# Patient Record
Sex: Female | Born: 1970 | Race: Black or African American | Hispanic: No | Marital: Married | Smoking: Never smoker
Health system: Southern US, Community
[De-identification: ages and names within clinical notes are randomized; demographics above are authoritative.]

## PROBLEM LIST (undated history)

## (undated) ENCOUNTER — Inpatient Hospital Stay (HOSPITAL_COMMUNITY): Payer: Self-pay

## (undated) DIAGNOSIS — Z8719 Personal history of other diseases of the digestive system: Secondary | ICD-10-CM

## (undated) DIAGNOSIS — IMO0002 Reserved for concepts with insufficient information to code with codable children: Secondary | ICD-10-CM

## (undated) DIAGNOSIS — M5412 Radiculopathy, cervical region: Secondary | ICD-10-CM

## (undated) DIAGNOSIS — D649 Anemia, unspecified: Secondary | ICD-10-CM

## (undated) DIAGNOSIS — O0940 Supervision of pregnancy with grand multiparity, unspecified trimester: Secondary | ICD-10-CM

## (undated) DIAGNOSIS — A630 Anogenital (venereal) warts: Secondary | ICD-10-CM

## (undated) DIAGNOSIS — R519 Headache, unspecified: Secondary | ICD-10-CM

## (undated) DIAGNOSIS — O09529 Supervision of elderly multigravida, unspecified trimester: Secondary | ICD-10-CM

## (undated) DIAGNOSIS — R51 Headache: Secondary | ICD-10-CM

## (undated) DIAGNOSIS — O24419 Gestational diabetes mellitus in pregnancy, unspecified control: Secondary | ICD-10-CM

## (undated) DIAGNOSIS — R87619 Unspecified abnormal cytological findings in specimens from cervix uteri: Secondary | ICD-10-CM

## (undated) HISTORY — PX: SPINE SURGERY: SHX786

## (undated) HISTORY — PX: HERNIA REPAIR: SHX51

## (undated) HISTORY — PX: GYNECOLOGIC CRYOSURGERY: SHX857

## (undated) HISTORY — DX: Anemia, unspecified: D64.9

## (undated) HISTORY — PX: CHOLECYSTECTOMY: SHX55

## (undated) HISTORY — DX: Reserved for concepts with insufficient information to code with codable children: IMO0002

## (undated) HISTORY — DX: Supervision of elderly multigravida, unspecified trimester: O09.529

## (undated) HISTORY — DX: Anogenital (venereal) warts: A63.0

## (undated) HISTORY — PX: OTHER SURGICAL HISTORY: SHX169

## (undated) HISTORY — PX: DILATION AND CURETTAGE OF UTERUS: SHX78

## (undated) HISTORY — DX: Unspecified abnormal cytological findings in specimens from cervix uteri: R87.619

## (undated) HISTORY — DX: Supervision of pregnancy with grand multiparity, unspecified trimester: O09.40

## (undated) HISTORY — DX: Gestational diabetes mellitus in pregnancy, unspecified control: O24.419

---

## 1998-05-06 ENCOUNTER — Encounter: Admission: RE | Admit: 1998-05-06 | Discharge: 1998-08-04 | Payer: Self-pay | Admitting: Orthopaedic Surgery

## 1998-08-06 ENCOUNTER — Inpatient Hospital Stay (HOSPITAL_COMMUNITY): Admission: AD | Admit: 1998-08-06 | Discharge: 1998-08-08 | Payer: Self-pay | Admitting: Obstetrics

## 1998-08-15 ENCOUNTER — Inpatient Hospital Stay (HOSPITAL_COMMUNITY): Admission: AD | Admit: 1998-08-15 | Discharge: 1998-08-15 | Payer: Self-pay | Admitting: Obstetrics

## 1998-11-01 ENCOUNTER — Inpatient Hospital Stay (HOSPITAL_COMMUNITY): Admission: AD | Admit: 1998-11-01 | Discharge: 1998-11-01 | Payer: Self-pay | Admitting: *Deleted

## 1998-11-11 ENCOUNTER — Ambulatory Visit (HOSPITAL_COMMUNITY): Admission: RE | Admit: 1998-11-11 | Discharge: 1998-11-11 | Payer: Self-pay | Admitting: *Deleted

## 1999-01-26 ENCOUNTER — Inpatient Hospital Stay (HOSPITAL_COMMUNITY): Admission: AD | Admit: 1999-01-26 | Discharge: 1999-01-26 | Payer: Self-pay | Admitting: Obstetrics and Gynecology

## 1999-01-29 ENCOUNTER — Other Ambulatory Visit: Admission: RE | Admit: 1999-01-29 | Discharge: 1999-01-29 | Payer: Self-pay | Admitting: Obstetrics and Gynecology

## 1999-02-25 ENCOUNTER — Inpatient Hospital Stay (HOSPITAL_COMMUNITY): Admission: AD | Admit: 1999-02-25 | Discharge: 1999-02-25 | Payer: Self-pay | Admitting: *Deleted

## 1999-02-26 ENCOUNTER — Inpatient Hospital Stay (HOSPITAL_COMMUNITY): Admission: AD | Admit: 1999-02-26 | Discharge: 1999-02-28 | Payer: Self-pay | Admitting: Obstetrics and Gynecology

## 1999-04-09 ENCOUNTER — Other Ambulatory Visit: Admission: RE | Admit: 1999-04-09 | Discharge: 1999-04-09 | Payer: Self-pay | Admitting: Obstetrics and Gynecology

## 1999-09-21 ENCOUNTER — Other Ambulatory Visit: Admission: RE | Admit: 1999-09-21 | Discharge: 1999-09-21 | Payer: Self-pay | Admitting: Obstetrics and Gynecology

## 1999-10-15 ENCOUNTER — Encounter (INDEPENDENT_AMBULATORY_CARE_PROVIDER_SITE_OTHER): Payer: Self-pay

## 1999-10-15 ENCOUNTER — Other Ambulatory Visit: Admission: RE | Admit: 1999-10-15 | Discharge: 1999-10-15 | Payer: Self-pay | Admitting: Obstetrics and Gynecology

## 2001-03-15 ENCOUNTER — Inpatient Hospital Stay (HOSPITAL_COMMUNITY): Admission: AD | Admit: 2001-03-15 | Discharge: 2001-03-15 | Payer: Self-pay | Admitting: Obstetrics and Gynecology

## 2001-03-15 ENCOUNTER — Encounter: Payer: Self-pay | Admitting: Obstetrics and Gynecology

## 2001-03-16 ENCOUNTER — Encounter (INDEPENDENT_AMBULATORY_CARE_PROVIDER_SITE_OTHER): Payer: Self-pay

## 2001-03-16 ENCOUNTER — Inpatient Hospital Stay (HOSPITAL_COMMUNITY): Admission: AD | Admit: 2001-03-16 | Discharge: 2001-03-16 | Payer: Self-pay | Admitting: Obstetrics and Gynecology

## 2001-03-26 ENCOUNTER — Inpatient Hospital Stay (HOSPITAL_COMMUNITY): Admission: AD | Admit: 2001-03-26 | Discharge: 2001-03-26 | Payer: Self-pay | Admitting: Obstetrics and Gynecology

## 2001-08-16 ENCOUNTER — Encounter (INDEPENDENT_AMBULATORY_CARE_PROVIDER_SITE_OTHER): Payer: Self-pay | Admitting: Specialist

## 2001-08-16 ENCOUNTER — Observation Stay (HOSPITAL_COMMUNITY): Admission: RE | Admit: 2001-08-16 | Discharge: 2001-08-16 | Payer: Self-pay | Admitting: Surgery

## 2001-12-22 ENCOUNTER — Other Ambulatory Visit: Admission: RE | Admit: 2001-12-22 | Discharge: 2001-12-22 | Payer: Self-pay | Admitting: Obstetrics and Gynecology

## 2002-01-22 ENCOUNTER — Ambulatory Visit (HOSPITAL_COMMUNITY): Admission: RE | Admit: 2002-01-22 | Discharge: 2002-01-22 | Payer: Self-pay | Admitting: Obstetrics and Gynecology

## 2002-01-22 ENCOUNTER — Encounter: Payer: Self-pay | Admitting: Obstetrics and Gynecology

## 2002-06-20 ENCOUNTER — Inpatient Hospital Stay (HOSPITAL_COMMUNITY): Admission: AD | Admit: 2002-06-20 | Discharge: 2002-06-20 | Payer: Self-pay | Admitting: Obstetrics and Gynecology

## 2002-06-24 ENCOUNTER — Inpatient Hospital Stay (HOSPITAL_COMMUNITY): Admission: AD | Admit: 2002-06-24 | Discharge: 2002-06-27 | Payer: Self-pay | Admitting: Obstetrics and Gynecology

## 2002-08-14 ENCOUNTER — Other Ambulatory Visit: Admission: RE | Admit: 2002-08-14 | Discharge: 2002-08-14 | Payer: Self-pay | Admitting: Obstetrics and Gynecology

## 2004-04-22 ENCOUNTER — Other Ambulatory Visit: Admission: RE | Admit: 2004-04-22 | Discharge: 2004-04-22 | Payer: Self-pay | Admitting: Obstetrics and Gynecology

## 2004-04-28 ENCOUNTER — Emergency Department (HOSPITAL_COMMUNITY): Admission: EM | Admit: 2004-04-28 | Discharge: 2004-04-28 | Payer: Self-pay | Admitting: Emergency Medicine

## 2004-10-02 ENCOUNTER — Inpatient Hospital Stay (HOSPITAL_COMMUNITY): Admission: AD | Admit: 2004-10-02 | Discharge: 2004-10-02 | Payer: Self-pay | Admitting: Obstetrics and Gynecology

## 2004-10-15 ENCOUNTER — Inpatient Hospital Stay (HOSPITAL_COMMUNITY): Admission: AD | Admit: 2004-10-15 | Discharge: 2004-10-17 | Payer: Self-pay | Admitting: Obstetrics and Gynecology

## 2005-01-04 ENCOUNTER — Encounter: Admission: RE | Admit: 2005-01-04 | Discharge: 2005-02-03 | Payer: Self-pay | Admitting: Obstetrics and Gynecology

## 2005-03-06 ENCOUNTER — Encounter: Admission: RE | Admit: 2005-03-06 | Discharge: 2005-04-05 | Payer: Self-pay | Admitting: Obstetrics and Gynecology

## 2005-04-06 ENCOUNTER — Encounter: Admission: RE | Admit: 2005-04-06 | Discharge: 2005-05-05 | Payer: Self-pay | Admitting: Obstetrics and Gynecology

## 2005-05-06 ENCOUNTER — Encounter: Admission: RE | Admit: 2005-05-06 | Discharge: 2005-06-05 | Payer: Self-pay | Admitting: Obstetrics and Gynecology

## 2005-06-06 ENCOUNTER — Encounter: Admission: RE | Admit: 2005-06-06 | Discharge: 2005-07-05 | Payer: Self-pay | Admitting: Obstetrics and Gynecology

## 2005-07-06 ENCOUNTER — Encounter: Admission: RE | Admit: 2005-07-06 | Discharge: 2005-08-05 | Payer: Self-pay | Admitting: Obstetrics and Gynecology

## 2005-08-06 ENCOUNTER — Encounter: Admission: RE | Admit: 2005-08-06 | Discharge: 2005-09-05 | Payer: Self-pay | Admitting: Obstetrics and Gynecology

## 2005-09-06 ENCOUNTER — Encounter: Admission: RE | Admit: 2005-09-06 | Discharge: 2005-10-03 | Payer: Self-pay | Admitting: Obstetrics and Gynecology

## 2005-10-04 ENCOUNTER — Encounter: Admission: RE | Admit: 2005-10-04 | Discharge: 2005-11-03 | Payer: Self-pay | Admitting: Obstetrics and Gynecology

## 2005-11-04 ENCOUNTER — Encounter: Admission: RE | Admit: 2005-11-04 | Discharge: 2005-11-15 | Payer: Self-pay | Admitting: Obstetrics and Gynecology

## 2006-05-18 ENCOUNTER — Inpatient Hospital Stay (HOSPITAL_COMMUNITY): Admission: AD | Admit: 2006-05-18 | Discharge: 2006-05-18 | Payer: Self-pay | Admitting: Internal Medicine

## 2006-10-28 ENCOUNTER — Inpatient Hospital Stay (HOSPITAL_COMMUNITY): Admission: AD | Admit: 2006-10-28 | Discharge: 2006-10-30 | Payer: Self-pay | Admitting: Obstetrics and Gynecology

## 2006-10-31 ENCOUNTER — Encounter: Admission: RE | Admit: 2006-10-31 | Discharge: 2006-11-29 | Payer: Self-pay | Admitting: Obstetrics and Gynecology

## 2006-11-30 ENCOUNTER — Encounter: Admission: RE | Admit: 2006-11-30 | Discharge: 2006-12-30 | Payer: Self-pay | Admitting: Obstetrics and Gynecology

## 2006-12-31 ENCOUNTER — Encounter: Admission: RE | Admit: 2006-12-31 | Discharge: 2007-01-29 | Payer: Self-pay | Admitting: Obstetrics and Gynecology

## 2007-01-30 ENCOUNTER — Encounter: Admission: RE | Admit: 2007-01-30 | Discharge: 2007-03-01 | Payer: Self-pay | Admitting: Obstetrics and Gynecology

## 2007-02-17 ENCOUNTER — Emergency Department (HOSPITAL_COMMUNITY): Admission: EM | Admit: 2007-02-17 | Discharge: 2007-02-17 | Payer: Self-pay | Admitting: Emergency Medicine

## 2007-03-02 ENCOUNTER — Encounter: Admission: RE | Admit: 2007-03-02 | Discharge: 2007-04-01 | Payer: Self-pay | Admitting: Obstetrics and Gynecology

## 2007-04-02 ENCOUNTER — Encounter: Admission: RE | Admit: 2007-04-02 | Discharge: 2007-05-01 | Payer: Self-pay | Admitting: Obstetrics and Gynecology

## 2007-05-02 ENCOUNTER — Encounter: Admission: RE | Admit: 2007-05-02 | Discharge: 2007-06-01 | Payer: Self-pay | Admitting: Obstetrics and Gynecology

## 2007-06-02 ENCOUNTER — Encounter: Admission: RE | Admit: 2007-06-02 | Discharge: 2007-07-01 | Payer: Self-pay | Admitting: Obstetrics and Gynecology

## 2007-07-02 ENCOUNTER — Encounter: Admission: RE | Admit: 2007-07-02 | Discharge: 2007-08-01 | Payer: Self-pay | Admitting: Obstetrics and Gynecology

## 2007-08-02 ENCOUNTER — Encounter: Admission: RE | Admit: 2007-08-02 | Discharge: 2007-09-01 | Payer: Self-pay | Admitting: Obstetrics and Gynecology

## 2007-09-02 ENCOUNTER — Encounter: Admission: RE | Admit: 2007-09-02 | Discharge: 2007-09-23 | Payer: Self-pay | Admitting: Obstetrics and Gynecology

## 2007-09-30 ENCOUNTER — Encounter: Admission: RE | Admit: 2007-09-30 | Discharge: 2007-10-30 | Payer: Self-pay | Admitting: Obstetrics and Gynecology

## 2007-10-31 ENCOUNTER — Encounter: Admission: RE | Admit: 2007-10-31 | Discharge: 2007-11-29 | Payer: Self-pay | Admitting: Obstetrics and Gynecology

## 2007-11-30 ENCOUNTER — Encounter: Admission: RE | Admit: 2007-11-30 | Discharge: 2007-12-05 | Payer: Self-pay | Admitting: Obstetrics and Gynecology

## 2009-07-24 ENCOUNTER — Encounter: Admission: RE | Admit: 2009-07-24 | Discharge: 2009-07-24 | Payer: Self-pay | Admitting: Family Medicine

## 2010-11-16 ENCOUNTER — Other Ambulatory Visit: Payer: Self-pay | Admitting: Obstetrics and Gynecology

## 2010-11-16 DIAGNOSIS — Z1231 Encounter for screening mammogram for malignant neoplasm of breast: Secondary | ICD-10-CM

## 2010-11-26 ENCOUNTER — Ambulatory Visit
Admission: RE | Admit: 2010-11-26 | Discharge: 2010-11-26 | Disposition: A | Discharge status: Home / Self Care | Payer: BC Managed Care – PPO | Source: Ambulatory Visit | Attending: Obstetrics and Gynecology | Admitting: Obstetrics and Gynecology

## 2010-11-26 DIAGNOSIS — Z1231 Encounter for screening mammogram for malignant neoplasm of breast: Secondary | ICD-10-CM

## 2010-12-11 NOTE — Op Note (Signed)
[facility]  Patient:    Rachel Zamora, Rachel Zamora Visit Number: [id number] MRN: [id number]          Service Type: SUR Location: 4W [id number] 01 Attending Physician:  Charlton Haws Dictated by:   Currie Paris, M.D. Proc. Date: 08/16/01 Admit Date:  08/16/2001 Discharge Date: 08/16/2001   CC:         Geraldo Pitter, M.D.  Petra Kuba, M.D.   Operative Report  VISIT NUMBER:  [id number].  OFFICE MEDICAL RECORD NUMBER:  [id number].  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  POSTOPERATIVE DIAGNOSIS:  Chronic calculus cholecystitis with umbilical hernia.  OPERATION:  Laparoscopic cholecystectomy with repair of umbilical hernia.  SURGEON:  Currie Paris, M.D.  ASSISTANT:  Zigmund Daniel, M.D.  ANESTHESIA:  General endotracheal.  CLINICAL HISTORY:  This is a 40 year old lady with gallstones and symptoms consistent with biliary colic. There was a question of tiny umbilical hernia also noted on physical preoperatively which is asymptomatic.  DESCRIPTION OF PROCEDURE:  The patient was seen in the holding area and had no further questions. Laboratory studies were unremarkable. The patient was taken to the operating room and after satisfactory condition general endotracheal anesthesia was obtained, the abdomen was prepped and draped. After injecting some local, I made an umbilical incision, identified the fascia, opened it and entered the peritoneal cavity. A Hasson was placed and held in place with a pursestring. The abdomen was insufflated to 15 and the patient placed in reverse Trendelenburg and tilted left. A 10 mm trocar was placed in the epigastrium and two 5 mm trocars placed laterally. The gallbladder was chronically inflamed, white in color, not particularly distended. It was retracted over the liver and multiple adhesions taken down. The patient is very thin and there appeared  to a very long cystic duct. It was thought perhaps there was some stones in it. Once we dissected that out, we could see its junction with the gallbladder. We could see well into the triangle of Calot because there was basically no fatty tissue there. We saw what appeared to be the hepatic artery arcing up and then up into the liver with the cystic artery coming off of that. The common duct was seen further down. Once the anatomy had been cleared up and we had everything dissected out, I put two clips on the stay side and one on the gallbladder side of the cystic duct and divided it. The artery was likewise divided. Another small area that I thought might have small vessels was clipped once and divided. The gallbladder was removed from below to above with cautery. Just prior to disconnecting it, at the very top of the liver edge, we got into the gallbladder and saw a little bit of white bile. Once it was disconnected, we checked for hemostasis and everything was dry. The gallbladder was placed in a bag and brought out the umbilical port. The lateral ports were removed. As I removed the umbilical port, I noted that it basically had a tiny strand of fascia and then an umbilical defect, so the skin incision was extended up into the umbilicus and the skin freed up so that I could close the umbilical hernia along with the fascial opening that I had made. This was done with several sutures of 0 Vicryl.  The abdomen was reinsufflated and a final check made with a camera in the epigastric port. That was removed and the abdomen deflated through  the epigastric port. I could see a little opening in the fascia there so I closed that with a 0 Vicryl.  The skin was closed with 4-0 Monocryl subcuticular plus Steri-Strips. The patient tolerated the procedure well. There were no operative complications. All counts were correct. Dictated by:   Currie Paris, M.D. Attending Physician:  Charlton Haws DD:  08/16/01 TD:  08/16/01 Job: [id number]

## 2010-12-11 NOTE — Discharge Summary (Signed)
Rachel Zamora, Rachel Zamora               ACCOUNT NO.:  [id number]   MEDICAL RECORD NO.:  [id number]          PATIENT TYPE:  INP   LOCATION:  9119                          FACILITY:  [facility]   PHYSICIAN:  Zenaida Niece, M.D.DATE OF BIRTH:  06/22/1971   DATE OF ADMISSION:  10/28/2006  DATE OF DISCHARGE:  10/30/2006                               DISCHARGE SUMMARY   ADMISSION DIAGNOSIS:  Intrauterine pregnancy at 37 weeks   DISCHARGE DIAGNOSIS:  Intrauterine pregnancy at 37 weeks.   PROCEDURES:  On October 28, 2006, she had a spontaneous vaginal delivery.   HISTORY AND PHYSICAL:  This is a 40 year old black female, gravida 61,  para 7-0-3-7 with an EGA of 37+ weeks who presents with complaint of  regular contractions and possibly leaking fluid.  In maternity  admissions, she was having regular contractions without cervical change;  however, the baby had a spontaneous deceleration to the 80s for  approximately 3 minutes with good recovery, so the patient was admitted  by Dr. Senaida Ores for delivery.   Prenatal care complicated by the fact that she is a grand multipara with  advanced maternal age with normal first trimester screen.   PRENATAL LABORATORY DATA:  Blood type is A+ with negative antibody  screen. Hemoglobin electrophoresis is normal, RPR nonreactive, rubella  immune, hepatitis B surface antigen negative, gonorrhea and chlamydia  negative; 1-hour Glucola is 144, 3-hour GTT is normal, and group B strep  is negative.   PAST OB HISTORY:  1. One elective termination.  2. Two spontaneous abortions.  3. Seven vaginal deliveries at term without complications with weights      ranging from 6 pounds 4 ounces to 8 pounds 13 ounces.   PAST MEDICAL HISTORY:  Significant for anemia.   PAST SURGICAL HISTORY:  Cholecystectomy and hernia repair.   PHYSICAL EXAMINATION:  VITAL SIGNS:  She is afebrile with stable vital  signs.  Fetal heart tracing is overall reassuring except for the one  spontaneous deceleration.  ABDOMEN:  Abdomen is gravid, nontender.  PELVIC:  Cervix is 3, 50, -2.  An amniotomy by Dr. Senaida Ores reveals  clear fluid. Fetal scalp electrode was placed.   HOSPITAL COURSE:  The patient was admitted and had amniotomy performed  and was started on Pitocin.  She initially did not progress quickly and  had an IUPC placed to better monitor her contractions.  She then entered  more active labor, progressed fairly quickly to complete, pushed well,  and on the afternoon of April4 had a vaginal delivery of a viable  female infant with Apgars of 9/9 with weighed 7 pounds 2 ounces.  Fetal  heart tracing was reassuring in labor.  Placenta delivered spontaneously  and was intact.  It was sent for cord blood collection.  She had a small  first-degree laceration which was hemostatic and not repaired.  Of note,  there is a well-healed small hole in the superior right labia from prior  trauma.  Estimated blood loss was less than 500 mL.   Postpartum, she had no significant complications.  Pre-delivery  hemoglobin 12.6, postdelivery 10.8.  On postpartum #2, she was felt to  be stable enough for discharge home.   DISCHARGE INSTRUCTIONS:  Regular diet, pelvic rest, follow up in 6  weeks.   MEDICATIONS:  1. Percocet, #20, one to two p.o. q. 4-6 hours p.r.n. pain.  2. Over-the-counter ibuprofen as needed.   She is also given our discharge pamphlet.      Zenaida Niece, M.D.  Electronically Signed     TDM/MEDQ  D:  10/30/2006  T:  10/30/2006  Job:  [id number]

## 2011-01-13 ENCOUNTER — Other Ambulatory Visit: Payer: Self-pay | Admitting: Family Medicine

## 2011-01-13 ENCOUNTER — Ambulatory Visit
Admission: RE | Admit: 2011-01-13 | Discharge: 2011-01-13 | Disposition: A | Discharge status: Home / Self Care | Payer: BC Managed Care – PPO | Source: Ambulatory Visit | Attending: Family Medicine | Admitting: Family Medicine

## 2011-01-13 DIAGNOSIS — W19XXXA Unspecified fall, initial encounter: Secondary | ICD-10-CM

## 2011-02-02 ENCOUNTER — Emergency Department (HOSPITAL_COMMUNITY)
Admission: EM | Admit: 2011-02-02 | Discharge: 2011-02-02 | Disposition: A | Discharge status: Home / Self Care | Payer: No Typology Code available for payment source | Attending: Emergency Medicine | Admitting: Emergency Medicine

## 2011-02-02 ENCOUNTER — Emergency Department (HOSPITAL_COMMUNITY): Payer: No Typology Code available for payment source

## 2011-02-02 DIAGNOSIS — S060X1A Concussion with loss of consciousness of 30 minutes or less, initial encounter: Secondary | ICD-10-CM | POA: Insufficient documentation

## 2011-02-02 DIAGNOSIS — R51 Headache: Secondary | ICD-10-CM | POA: Insufficient documentation

## 2011-05-10 LAB — POCT I-STAT CREATININE
Creatinine, Ser: 0.6
Operator id: 279831

## 2011-05-10 LAB — CBC
HCT: 36.5
MCHC: 34
MCV: 88.5
Platelets: 331
WBC: 8.1

## 2011-05-10 LAB — DIFFERENTIAL
Basophils Relative: 0
Eosinophils Absolute: 0.1
Eosinophils Relative: 1
Lymphs Abs: 2.6
Monocytes Relative: 7

## 2011-05-10 LAB — I-STAT 8, (EC8 V) (CONVERTED LAB)
BUN: 7
Chloride: 104
HCT: 41
Hemoglobin: 13.9
Operator id: 279831
Sodium: 140

## 2011-05-10 LAB — POCT CARDIAC MARKERS: Operator id: 279831

## 2011-07-27 NOTE — L&D Delivery Note (Signed)
Delivery Note At 5:53 PM a viable female was delivered via Vaginal, Spontaneous Delivery (Presentation: Middle Occiput Anterior).  APGAR: 9, 9; weight pending .   Placenta status: Intact, Spontaneous.  Cord: 3 vessels with the following complications:Tight nuchal x 2 delivered through.  Anesthesia: Epidural  Episiotomy: None Lacerations: 1st degree Suture Repair: 3.0 vicryl rapide Est. Blood Loss (mL): 300cc  Mom to postpartum.  Baby to stay in room with mom.  Oliver Pila 05/12/2012, 6:14 PM

## 2011-10-18 LAB — OB RESULTS CONSOLE HIV ANTIBODY (ROUTINE TESTING): HIV: NONREACTIVE

## 2011-10-18 LAB — OB RESULTS CONSOLE ABO/RH: RH Type: POSITIVE

## 2011-10-18 LAB — OB RESULTS CONSOLE HEPATITIS B SURFACE ANTIGEN: Hepatitis B Surface Ag: NEGATIVE

## 2011-10-18 LAB — OB RESULTS CONSOLE RUBELLA ANTIBODY, IGM: Rubella: IMMUNE

## 2011-12-22 ENCOUNTER — Encounter: Payer: BC Managed Care – PPO | Attending: Obstetrics and Gynecology | Admitting: Dietician

## 2011-12-22 DIAGNOSIS — O99810 Abnormal glucose complicating pregnancy: Secondary | ICD-10-CM | POA: Insufficient documentation

## 2011-12-22 DIAGNOSIS — Z713 Dietary counseling and surveillance: Secondary | ICD-10-CM | POA: Insufficient documentation

## 2011-12-23 ENCOUNTER — Encounter: Payer: Self-pay | Admitting: Dietician

## 2011-12-23 NOTE — Progress Notes (Signed)
  Patient was seen on 12/22/2011 for Gestational Diabetes self-management class at the [facility]. The following learning objectives were met by the patient during this course:   States the definition of Gestational Diabetes  States why dietary management is important in controlling blood glucose  Describes the effects each nutrient has on blood glucose levels  Demonstrates ability to create a balanced meal plan  Demonstrates carbohydrate counting   States when to check blood glucose levels  Demonstrates proper blood glucose monitoring techniques  States the effect of stress and exercise on blood glucose levels  States the importance of limiting caffeine and abstaining from alcohol and smoking  Blood glucose monitor given: One Touch Ultra Mini with the [employer] Lot # [id number] X Exp: 05/2012 Blood glucose reading: 91 at 6:30 PM  Patient instructed to monitor glucose levels: FBS: 60 - <90 1 hour: <140   Patient received handouts:  Nutrition Diabetes and Pregnancy  Carbohydrate Counting List  Patient will be seen for follow-up as needed.

## 2012-04-30 ENCOUNTER — Encounter (HOSPITAL_COMMUNITY): Payer: Self-pay

## 2012-04-30 ENCOUNTER — Emergency Department (HOSPITAL_COMMUNITY)
Admission: EM | Admit: 2012-04-30 | Discharge: 2012-04-30 | Disposition: A | Discharge status: Home / Self Care | Payer: BC Managed Care – PPO | Source: Home / Self Care | Attending: Emergency Medicine | Admitting: Emergency Medicine

## 2012-04-30 DIAGNOSIS — H00011 Hordeolum externum right upper eyelid: Secondary | ICD-10-CM

## 2012-04-30 DIAGNOSIS — H00019 Hordeolum externum unspecified eye, unspecified eyelid: Secondary | ICD-10-CM

## 2012-04-30 MED ORDER — CEPHALEXIN 500 MG PO CAPS: 500.0000 mg | ORAL_CAPSULE | Freq: Three times a day (TID) | ORAL | Status: DC

## 2012-04-30 MED ORDER — TOBRAMYCIN 0.3 % OP SOLN: 1.0000 [drp] | OPHTHALMIC | Status: DC

## 2012-04-30 NOTE — ED Notes (Signed)
Right eye swollen and painful x 2 days, patient states had same problem approx 6 weeks ago

## 2012-04-30 NOTE — ED Provider Notes (Signed)
Chief Complaint  Patient presents with  . Eye Problem    History of Present Illness:   Rachel Zamora is a 41 year old female who is pregnant and at near term. For the past 3-4 days she's had swelling and slight tenderness of the right upper lid. She's had a little crusting of the eyelids but no redness of the eyes, diplopia, or blurry vision. She had a similar episode about a month ago and went to an urgent care on [address]. She was given an antibiotic drop and the symptoms got better but then they recurred again. She denies fever, chills, headache, nasal congestion, rhinorrhea, sore throat, or adenopathy.  Review of Systems:  Other than noted above, the patient denies any of the following symptoms: Systemic:  No fever, chills, sweats, fatigue, or weight loss. Eye:  No redness, eye pain, photophobia, discharge, blurred vision, or diplopia. ENT:  No nasal congestion, rhinorrhea, or sore throat. Lymphatic:  No adenopathy. Skin:  No rash or pruritis.  PMFSH:  Past medical history, family history, social history, meds, and allergies were reviewed.  Physical Exam:   Vital signs:  BP 153/94  Pulse 83  Temp 98.4 F (36.9 C) (Oral)  Resp 18  SpO2 99%  LMP 11/25/2010 General:  Alert and in no distress. Eye:  There was swelling, erythema, and slight tenderness to palpation over the mid upper lid at the eyelid margin and otherwise lids and conjunctiva is were normal. There was no conjunctival injection or discharge. Anterior chambers are normal. PERRLA, full EOMs, fundi were benign. ENT:  TMs and canals clear.  Nasal mucosa normal.  No intra-oral lesions, mucous membranes moist, pharynx clear. Neck:  No adenopathy tenderness or mass. Skin:  Clear, warm and dry.    Assessment:  The encounter diagnosis was Hordeolum externum of right upper eyelid.  Plan:   1.  The following meds were prescribed:   New Prescriptions   CEPHALEXIN (KEFLEX) 500 MG CAPSULE    Take 1 capsule (500 mg total) by mouth 3  (three) times daily.   TOBRAMYCIN (TOBREX) 0.3 % OPHTHALMIC SOLUTION    Place 1 drop into the right eye every 4 (four) hours.   2.  The patient was instructed in symptomatic care and handouts were given. 3.  The patient was told to return if becoming worse in any way, if no better in 3 or 4 days, and given some red flag symptoms that would indicate earlier return.     Reuben Likes, MD 04/30/12 [phone]

## 2012-05-07 ENCOUNTER — Encounter (HOSPITAL_COMMUNITY): Payer: Self-pay | Admitting: *Deleted

## 2012-05-07 ENCOUNTER — Inpatient Hospital Stay (HOSPITAL_COMMUNITY)
Admission: AD | Admit: 2012-05-07 | Discharge: 2012-05-07 | Disposition: A | Discharge status: Home / Self Care | Payer: BC Managed Care – PPO | Source: Ambulatory Visit | Attending: Obstetrics and Gynecology | Admitting: Obstetrics and Gynecology

## 2012-05-07 DIAGNOSIS — O479 False labor, unspecified: Secondary | ICD-10-CM | POA: Insufficient documentation

## 2012-05-07 NOTE — MAU Note (Signed)
Patient states she is having contractions every 6 minutes. Denies leaking or bleeding. Reports fetal movement but not as much as usual.

## 2012-05-07 NOTE — MAU Note (Signed)
Pt reports having ctx on and off since 1030 las tnigth. Denies SROM or bleeding at this time and reports good fetal movement.

## 2012-05-08 ENCOUNTER — Telehealth (HOSPITAL_COMMUNITY): Payer: Self-pay | Admitting: *Deleted

## 2012-05-08 ENCOUNTER — Encounter (HOSPITAL_COMMUNITY): Payer: Self-pay | Admitting: *Deleted

## 2012-05-08 NOTE — Telephone Encounter (Signed)
Preadmission screen  

## 2012-05-09 ENCOUNTER — Telehealth (HOSPITAL_COMMUNITY): Payer: Self-pay | Admitting: *Deleted

## 2012-05-09 ENCOUNTER — Encounter (HOSPITAL_COMMUNITY): Payer: Self-pay | Admitting: *Deleted

## 2012-05-09 NOTE — Telephone Encounter (Signed)
Preadmission screen  

## 2012-05-11 NOTE — H&P (Signed)
Rachel Zamora is a 41 y.o. female G12 [phone] at 39+weeks (EDD 05/16/12 by LMP c/w 9 week [place])  presenting for IOL at term.  Prenatal care complicated by GDM, well-controlled on diet. Pt is AMA at 41 y/o--she had normal first trimester screen and declined Harmony testing.  THere were no other significant prenatal issues.  Maternal Medical History:  Prenatal Complications - Diabetes: gestational. Diabetes is managed by diet.      OB History    Grav Para Term Preterm Abortions TAB SAB Ect Mult Living   12 8 8  3  0 2   8    NSVD x 8    7-8#13oz  Past Medical History  Diagnosis Date  . Abnormal Pap smear   . AMA (advanced maternal age) multigravida 35+   . Anemia   . HPV (human papilloma virus) anogenital infection   . Grand multiparity in labor and delivery, antepartum   . Gestational diabetes     diet controlled   Past Surgical History  Procedure Date  . Cholecystectomy   . Gynecologic cryosurgery   . Dilation and curettage of uterus   . Tab   . Hernia repair    Family History: family history includes Diabetes in her father. Social History:  reports that she has never smoked. She has never used smokeless tobacco. She reports that she does not drink alcohol or use illicit drugs.   Prenatal Transfer Tool  Maternal Diabetes: Yes:  Diabetes Type:  Diet controlled GDM Genetic Screening: Normal Maternal Ultrasounds/Referrals: Normal Fetal Ultrasounds or other Referrals:  None Maternal Substance Abuse:  No Significant Maternal Medications:  None Significant Maternal Lab Results:  None Other Comments:  None  Review of Systems  Gastrointestinal: Negative for abdominal pain.  Neurological: Negative for headaches.      Last menstrual period 11/25/2010. Exam Physical Exam  Constitutional: She is oriented to person, place, and time. She appears well-developed and well-nourished.  Cardiovascular: Normal rate and regular rhythm.   Respiratory: Effort normal and breath sounds  normal.  GI: Soft.  Genitourinary: Vagina normal.  Neurological: She is alert and oriented to person, place, and time.  Psychiatric: She has a normal mood and affect. Her behavior is normal.    Prenatal labs: ABO, Rh: A/Positive/-- (03/25 0000) Antibody: Negative (03/25 0000) Rubella: Immune (03/25 0000) RPR: Nonreactive (03/25 0000)  HBsAg: Negative (03/25 0000)  HIV: Non-reactive (03/25 0000)  GBS: Negative (09/26 0000)  First trimester screen and AFP WNL, declined Harmony Early one hour gtt 160--began fingersticks Hgb AA   Assessment/Plan: Pt for IOL at term.  Grandmultipara.  Will plan pitocin followed by AROM when  Vertex well--applied.   Oliver Pila 05/11/2012, 10:04 PM

## 2012-05-12 ENCOUNTER — Inpatient Hospital Stay (HOSPITAL_COMMUNITY): Payer: BC Managed Care – PPO | Admitting: Anesthesiology

## 2012-05-12 ENCOUNTER — Encounter (HOSPITAL_COMMUNITY): Payer: Self-pay

## 2012-05-12 ENCOUNTER — Encounter (HOSPITAL_COMMUNITY): Payer: Self-pay | Admitting: Anesthesiology

## 2012-05-12 ENCOUNTER — Inpatient Hospital Stay (HOSPITAL_COMMUNITY)
Admission: RE | Admit: 2012-05-12 | Discharge: 2012-05-14 | DRG: 372 | Disposition: A | Discharge status: Home / Self Care | Payer: BC Managed Care – PPO | Source: Ambulatory Visit | Attending: Obstetrics and Gynecology | Admitting: Obstetrics and Gynecology

## 2012-05-12 DIAGNOSIS — O09529 Supervision of elderly multigravida, unspecified trimester: Secondary | ICD-10-CM | POA: Diagnosis present

## 2012-05-12 DIAGNOSIS — O99814 Abnormal glucose complicating childbirth: Principal | ICD-10-CM | POA: Diagnosis present

## 2012-05-12 LAB — COMPREHENSIVE METABOLIC PANEL WITH GFR
ALT: 12 U/L (ref 0–35)
AST: 14 U/L (ref 0–37)
Albumin: 2.7 g/dL — ABNORMAL LOW (ref 3.5–5.2)
Alkaline Phosphatase: 132 U/L — ABNORMAL HIGH (ref 39–117)
BUN: 8 mg/dL (ref 6–23)
CO2: 22 meq/L (ref 19–32)
Calcium: 8.7 mg/dL (ref 8.4–10.5)
Chloride: 101 meq/L (ref 96–112)
Creatinine, Ser: 0.47 mg/dL — ABNORMAL LOW (ref 0.50–1.10)
GFR calc Af Amer: >90 mL/min
GFR calc non Af Amer: >90 mL/min
Glucose, Bld: 87 mg/dL (ref 70–99)
Potassium: 3.9 meq/L (ref 3.5–5.1)
Sodium: 134 meq/L — ABNORMAL LOW (ref 135–145)
Total Bilirubin: 0.2 mg/dL — ABNORMAL LOW (ref 0.3–1.2)
Total Protein: 6.4 g/dL (ref 6.0–8.3)

## 2012-05-12 LAB — CBC
HCT: 34.7 % — ABNORMAL LOW (ref 36.0–46.0)
Hemoglobin: 11.7 g/dL — ABNORMAL LOW (ref 12.0–15.0)
MCH: 29.2 pg (ref 26.0–34.0)
MCHC: 33.7 g/dL (ref 30.0–36.0)
MCV: 86.5 fL (ref 78.0–100.0)
Platelets: 231 K/uL (ref 150–400)
RBC: 4.01 MIL/uL (ref 3.87–5.11)
RDW: 14.5 % (ref 11.5–15.5)
WBC: 10.2 K/uL (ref 4.0–10.5)

## 2012-05-12 LAB — TYPE AND SCREEN: Antibody Screen: NEGATIVE

## 2012-05-12 MED ORDER — ONDANSETRON HCL 4 MG PO TABS: 4.0000 mg | ORAL_TABLET | ORAL | Status: DC | PRN

## 2012-05-12 MED ORDER — IBUPROFEN 600 MG PO TABS
600.0000 mg | ORAL_TABLET | Freq: Four times a day (QID) | ORAL | Status: DC
  Administered 2012-05-12 – 2012-05-14 (×8): 600 mg via ORAL
  Filled 2012-05-12 (×8): qty 1

## 2012-05-12 MED ORDER — OXYCODONE-ACETAMINOPHEN 5-325 MG PO TABS: 1.0000 | ORAL_TABLET | ORAL | Status: DC | PRN

## 2012-05-12 MED ORDER — ONDANSETRON HCL 4 MG/2ML IJ SOLN: 4.0000 mg | Freq: Four times a day (QID) | INTRAMUSCULAR | Status: DC | PRN

## 2012-05-12 MED ORDER — ACETAMINOPHEN 325 MG PO TABS: 650.0000 mg | ORAL_TABLET | ORAL | Status: DC | PRN

## 2012-05-12 MED ORDER — LACTATED RINGERS IV SOLN
INTRAVENOUS | Status: DC
  Administered 2012-05-12: 08:00:00 via INTRAVENOUS

## 2012-05-12 MED ORDER — OXYCODONE-ACETAMINOPHEN 5-325 MG PO TABS
1.0000 | ORAL_TABLET | ORAL | Status: DC | PRN
  Administered 2012-05-13 (×3): 1 via ORAL
  Filled 2012-05-12 (×3): qty 1

## 2012-05-12 MED ORDER — OXYTOCIN 40 UNITS IN LACTATED RINGERS INFUSION - SIMPLE MED
1.0000 m[IU]/min | INTRAVENOUS | Status: DC
  Administered 2012-05-12: 2 m[IU]/min via INTRAVENOUS
  Filled 2012-05-12: qty 1000

## 2012-05-12 MED ORDER — ZOLPIDEM TARTRATE 5 MG PO TABS: 5.0000 mg | ORAL_TABLET | Freq: Every evening | ORAL | Status: DC | PRN

## 2012-05-12 MED ORDER — PHENYLEPHRINE 40 MCG/ML (10ML) SYRINGE FOR IV PUSH (FOR BLOOD PRESSURE SUPPORT)
80.0000 ug | PREFILLED_SYRINGE | INTRAVENOUS | Status: DC | PRN
  Filled 2012-05-12: qty 5

## 2012-05-12 MED ORDER — DIBUCAINE 1 % RE OINT: 1.0000 "application " | TOPICAL_OINTMENT | RECTAL | Status: DC | PRN

## 2012-05-12 MED ORDER — LACTATED RINGERS IV SOLN: 500.0000 mL | INTRAVENOUS | Status: DC | PRN

## 2012-05-12 MED ORDER — FENTANYL 2.5 MCG/ML BUPIVACAINE 1/10 % EPIDURAL INFUSION (WH - ANES)
INTRAMUSCULAR | Status: DC | PRN
  Administered 2012-05-12: 14 mL/h via EPIDURAL

## 2012-05-12 MED ORDER — WITCH HAZEL-GLYCERIN EX PADS: 1.0000 "application " | MEDICATED_PAD | CUTANEOUS | Status: DC | PRN

## 2012-05-12 MED ORDER — OXYTOCIN BOLUS FROM INFUSION
500.0000 mL | INTRAVENOUS | Status: DC
  Filled 2012-05-12 (×63): qty 500

## 2012-05-12 MED ORDER — SENNOSIDES-DOCUSATE SODIUM 8.6-50 MG PO TABS
2.0000 | ORAL_TABLET | Freq: Every day | ORAL | Status: DC
  Administered 2012-05-12 – 2012-05-13 (×2): 2 via ORAL

## 2012-05-12 MED ORDER — TOBRAMYCIN 0.3 % OP SOLN
1.0000 [drp] | OPHTHALMIC | Status: DC
  Administered 2012-05-12 – 2012-05-14 (×5): 1 [drp] via OPHTHALMIC

## 2012-05-12 MED ORDER — DIPHENHYDRAMINE HCL 25 MG PO CAPS: 25.0000 mg | ORAL_CAPSULE | Freq: Four times a day (QID) | ORAL | Status: DC | PRN

## 2012-05-12 MED ORDER — PRENATAL MULTIVITAMIN CH
1.0000 | ORAL_TABLET | Freq: Every day | ORAL | Status: DC
  Administered 2012-05-12: 1 via ORAL
  Filled 2012-05-12: qty 1

## 2012-05-12 MED ORDER — ONDANSETRON HCL 4 MG/2ML IJ SOLN: 4.0000 mg | INTRAMUSCULAR | Status: DC | PRN

## 2012-05-12 MED ORDER — SIMETHICONE 80 MG PO CHEW: 80.0000 mg | CHEWABLE_TABLET | ORAL | Status: DC | PRN

## 2012-05-12 MED ORDER — BENZOCAINE-MENTHOL 20-0.5 % EX AERO
1.0000 "application " | INHALATION_SPRAY | CUTANEOUS | Status: DC | PRN
  Administered 2012-05-12: 1 via TOPICAL
  Filled 2012-05-12: qty 56

## 2012-05-12 MED ORDER — FENTANYL 2.5 MCG/ML BUPIVACAINE 1/10 % EPIDURAL INFUSION (WH - ANES)
14.0000 mL/h | INTRAMUSCULAR | Status: DC
  Filled 2012-05-12: qty 125

## 2012-05-12 MED ORDER — OXYTOCIN 40 UNITS IN LACTATED RINGERS INFUSION - SIMPLE MED
62.5000 mL/h | INTRAVENOUS | Status: DC
  Administered 2012-05-12: 999 mL/h via INTRAVENOUS

## 2012-05-12 MED ORDER — PHENYLEPHRINE 40 MCG/ML (10ML) SYRINGE FOR IV PUSH (FOR BLOOD PRESSURE SUPPORT): 80.0000 ug | PREFILLED_SYRINGE | INTRAVENOUS | Status: DC | PRN

## 2012-05-12 MED ORDER — LIDOCAINE HCL (PF) 1 % IJ SOLN: 30.0000 mL | INTRAMUSCULAR | Status: DC | PRN

## 2012-05-12 MED ORDER — CITRIC ACID-SODIUM CITRATE 334-500 MG/5ML PO SOLN: 30.0000 mL | ORAL | Status: DC | PRN

## 2012-05-12 MED ORDER — PRENATAL MULTIVITAMIN CH: 1.0000 | ORAL_TABLET | Freq: Every day | ORAL | Status: DC

## 2012-05-12 MED ORDER — BUTORPHANOL TARTRATE 1 MG/ML IJ SOLN: 1.0000 mg | INTRAMUSCULAR | Status: DC | PRN

## 2012-05-12 MED ORDER — IBUPROFEN 600 MG PO TABS: 600.0000 mg | ORAL_TABLET | Freq: Four times a day (QID) | ORAL | Status: DC | PRN

## 2012-05-12 MED ORDER — TERBUTALINE SULFATE 1 MG/ML IJ SOLN: 0.2500 mg | Freq: Once | INTRAMUSCULAR | Status: DC | PRN

## 2012-05-12 MED ORDER — DIPHENHYDRAMINE HCL 50 MG/ML IJ SOLN: 12.5000 mg | INTRAMUSCULAR | Status: DC | PRN

## 2012-05-12 MED ORDER — TETANUS-DIPHTH-ACELL PERTUSSIS 5-2.5-18.5 LF-MCG/0.5 IM SUSP: 0.5000 mL | Freq: Once | INTRAMUSCULAR | Status: DC

## 2012-05-12 MED ORDER — LIDOCAINE HCL (PF) 1 % IJ SOLN
INTRAMUSCULAR | Status: DC | PRN
  Administered 2012-05-12 (×2): 9 mL

## 2012-05-12 MED ORDER — LANOLIN HYDROUS EX OINT: TOPICAL_OINTMENT | CUTANEOUS | Status: DC | PRN

## 2012-05-12 MED ORDER — EPHEDRINE 5 MG/ML INJ: 10.0000 mg | INTRAVENOUS | Status: DC | PRN

## 2012-05-12 MED ORDER — LACTATED RINGERS IV SOLN
500.0000 mL | Freq: Once | INTRAVENOUS | Status: AC
  Administered 2012-05-12: 500 mL via INTRAVENOUS

## 2012-05-12 MED ORDER — EPHEDRINE 5 MG/ML INJ
10.0000 mg | INTRAVENOUS | Status: DC | PRN
  Filled 2012-05-12: qty 4

## 2012-05-12 NOTE — Progress Notes (Signed)
   Subjective: Ptwith no sig contractions, getting started on pitocin  Objective: Ht 5\' 6"  (1.676 m)  Wt 95.255 kg (210 lb)  BMI 33.89 kg/m2  LMP 11/25/2010      FHT:  FHR: 135 bpm, variability: moderate,  accelerations:  Present,  decelerations:  Absent UC: irregular SVE:   Dilation: 2 Effacement (%): 50 Station: -3 Exam by:: lee  Labs: Lab Results  Component Value Date   WBC 8.1 02/17/2007   HGB 13.9 02/17/2007   HCT 41.0 02/17/2007   MCV 88.5 02/17/2007   PLT 331 02/17/2007    Assessment / Plan: Will place on pitocin and AROM when vertex lower Ayah Cozzolino W 05/12/2012, 8:27 AM

## 2012-05-12 NOTE — Anesthesia Preprocedure Evaluation (Signed)
Anesthesia Evaluation  Patient identified by MRN, date of birth, ID band Patient awake    Reviewed: Allergy & Precautions, H&P , NPO status , Patient's Chart, lab work & pertinent test results  Airway Mallampati: II TM Distance: >3 FB Neck ROM: full    Dental No notable dental hx.    Pulmonary neg pulmonary ROS,    Pulmonary exam normal       Cardiovascular negative cardio ROS      Neuro/Psych negative neurological ROS  negative psych ROS   GI/Hepatic negative GI ROS, Neg liver ROS,   Endo/Other    Renal/GU negative Renal ROS  negative genitourinary   Musculoskeletal negative musculoskeletal ROS (+)   Abdominal (+) + obese,   Peds  Hematology negative hematology ROS (+)   Anesthesia Other Findings   Reproductive/Obstetrics (+) Pregnancy                           Anesthesia Physical Anesthesia Plan  ASA: II  Anesthesia Plan: Epidural   Post-op Pain Management:    Induction:   Airway Management Planned:   Additional Equipment:   Intra-op Plan:   Post-operative Plan:   Informed Consent: I have reviewed the patients History and Physical, chart, labs and discussed the procedure including the risks, benefits and alternatives for the proposed anesthesia with the patient or authorized representative who has indicated his/her understanding and acceptance.     Plan Discussed with:   Anesthesia Plan Comments:         Anesthesia Quick Evaluation

## 2012-05-12 NOTE — Anesthesia Procedure Notes (Signed)
Epidural Patient location during procedure: OB Start time: 05/12/2012 1:41 PM End time: 05/12/2012 1:45 PM  Staffing Anesthesiologist: Sandrea Hughs Performed by: anesthesiologist   Preanesthetic Checklist Completed: patient identified, site marked, surgical consent, pre-op evaluation, timeout performed, IV checked, risks and benefits discussed and monitors and equipment checked  Epidural Patient position: sitting Prep: site prepped and draped and DuraPrep Patient monitoring: continuous pulse ox and blood pressure Approach: midline Injection technique: LOR air  Needle:  Needle type: Tuohy  Needle gauge: 17 G Needle length: 9 cm and 9 Needle insertion depth: 6 cm Catheter type: closed end flexible Catheter size: 19 Gauge Catheter at skin depth: 11 cm Test dose: negative and Other  Assessment Sensory level: T9 Events: blood not aspirated, injection not painful, no injection resistance, negative IV test and no paresthesia  Additional Notes Reason for block:procedure for pain

## 2012-05-12 NOTE — Progress Notes (Signed)
   Subjective: Pt getting uncomfortable, desires epidural  Objective: BP 118/72  Pulse 85  Temp 97.7 F (36.5 C) (Oral)  Resp 18  Ht 5\' 6"  (1.676 m)  Wt 95.255 kg (210 lb)  BMI 33.89 kg/m2  LMP 11/25/2010      FHT:  FHR: 140 bpm, variability: moderate,  accelerations:  Present,  decelerations:  Absent UC:   regular, every 3-4 minutes SVE:   Dilation: 3.5 Effacement (%): 50 Station: -2 Exam by:: Damani Rando AROM clear  Labs: Lab Results  Component Value Date   WBC 10.2 05/12/2012   HGB 11.7* 05/12/2012   HCT 34.7* 05/12/2012   MCV 86.5 05/12/2012   PLT 231 05/12/2012    Assessment / Plan: To get epidural.  Follow progress  Nishat Livingston W 05/12/2012, 1:10 PM

## 2012-05-13 LAB — CBC
Platelets: 201 10*3/uL (ref 150–400)
RDW: 14.4 % (ref 11.5–15.5)

## 2012-05-13 NOTE — Progress Notes (Signed)
Post Partum Day 1 Subjective: no complaints and tolerating PO  Objective: Blood pressure 116/83, pulse 89, temperature 98.9 F (37.2 C), temperature source Oral, resp. rate 18, height 5\' 6"  (1.676 m), weight 95.255 kg (210 lb), last menstrual period 11/25/2010, SpO2 97.00%, unknown if currently breastfeeding.  Physical Exam:  General: alert and cooperative Lochia: appropriate Uterine Fundus: firm    Basename 05/13/12 0530 05/12/12 0800  HGB 10.5* 11.7*  HCT 30.9* 34.7*    Assessment/Plan: Plan for discharge tomorrow   LOS: 1 day   Rachel Zamora 05/13/2012, 10:32 AM

## 2012-05-13 NOTE — Anesthesia Postprocedure Evaluation (Signed)
  Anesthesia Post-op Note  Patient: Rachel Zamora  Procedure(s) Performed: * No procedures listed *  Patient Location: Mother/Baby  Anesthesia Type: Epidural  Level of Consciousness: awake  Airway and Oxygen Therapy: Patient Spontanous Breathing  Post-op Pain: mild  Post-op Assessment: Patient's Cardiovascular Status Stable and Respiratory Function Stable  Post-op Vital Signs: stable  Complications: No apparent anesthesia complications

## 2012-05-14 ENCOUNTER — Encounter (HOSPITAL_COMMUNITY)
Admission: RE | Admit: 2012-05-14 | Discharge: 2012-05-14 | Disposition: A | Discharge status: Home / Self Care | Payer: BC Managed Care – PPO | Source: Ambulatory Visit | Attending: Obstetrics and Gynecology | Admitting: Obstetrics and Gynecology

## 2012-05-14 DIAGNOSIS — O923 Agalactia: Secondary | ICD-10-CM | POA: Insufficient documentation

## 2012-05-14 MED ORDER — OXYCODONE-ACETAMINOPHEN 5-325 MG PO TABS: 1.0000 | ORAL_TABLET | ORAL | Status: DC | PRN

## 2012-05-14 MED ORDER — IBUPROFEN 600 MG PO TABS: 600.0000 mg | ORAL_TABLET | Freq: Four times a day (QID) | ORAL | Status: DC

## 2012-05-14 NOTE — Discharge Summary (Signed)
Obstetric Discharge Summary Reason for Admission: induction of labor Prenatal Procedures: none Intrapartum Procedures: spontaneous vaginal delivery Postpartum Procedures: none Complications-Operative and Postpartum: first degree perineal laceration Hemoglobin  Date Value Range Status  05/13/2012 10.5* 12.0 - 15.0 g/dL Final     HCT  Date Value Range Status  05/13/2012 30.9* 36.0 - 46.0 % Final    Physical Exam:  General: alert and cooperative Lochia: appropriate Uterine Fundus: firm   Discharge Diagnoses: Term Pregnancy-delivered  Discharge Information: Date: 05/14/2012 Activity: pelvic rest Diet: routine Medications: Ibuprofen and Percocet Condition: improved Instructions: refer to practice specific booklet Discharge to: home Follow-up Information    Follow up with Oliver Pila, MD. Schedule an appointment as soon as possible for a visit in 6 weeks.   Contact information:   [address], [address] [place] [address] [phone]          Newborn Data: Live born female  Birth Weight: 7 lb 12 oz (3515 g) APGAR: 9, 9  Home with mother.  Yolanda Dockendorf W 05/14/2012, 11:03 AM

## 2012-05-14 NOTE — Progress Notes (Signed)
Post Partum Day 2 Subjective: no complaints, up ad lib and tolerating PO  Baby having oxygen level followed, 94% on room air  Objective: Blood pressure 123/79, pulse 85, temperature 98.6 F (37 C), temperature source Oral, resp. rate 18, height 5\' 6"  (1.676 m), weight 95.255 kg (210 lb), last menstrual period 11/25/2010, SpO2 97.00%, unknown if currently breastfeeding.  Physical Exam:  General: alert and cooperative Lochia: appropriate Uterine Fundus: firm    Basename 05/13/12 0530 05/12/12 0800  HGB 10.5* 11.7*  HCT 30.9* 34.7*    Assessment/Plan: Discharge home Motrin and percocet   LOS: 2 days   Danniell Rotundo W 05/14/2012, 10:59 AM

## 2012-05-16 NOTE — Progress Notes (Signed)
Post-discharge UR chart review completed. 

## 2012-05-17 MED ORDER — TOBRAMYCIN 0.3 % OP SOLN: 1.0000 [drp] | OPHTHALMIC | Status: DC

## 2012-05-19 ENCOUNTER — Encounter (HOSPITAL_COMMUNITY): Payer: Self-pay | Admitting: *Deleted

## 2012-05-19 ENCOUNTER — Inpatient Hospital Stay (HOSPITAL_COMMUNITY)
Admission: AD | Admit: 2012-05-19 | Discharge: 2012-05-20 | Disposition: A | Discharge status: Home / Self Care | Payer: BC Managed Care – PPO | Source: Ambulatory Visit | Attending: Obstetrics and Gynecology | Admitting: Obstetrics and Gynecology

## 2012-05-19 DIAGNOSIS — O864 Pyrexia of unknown origin following delivery: Secondary | ICD-10-CM | POA: Insufficient documentation

## 2012-05-19 DIAGNOSIS — O165 Unspecified maternal hypertension, complicating the puerperium: Secondary | ICD-10-CM

## 2012-05-19 DIAGNOSIS — O9122 Nonpurulent mastitis associated with the puerperium: Secondary | ICD-10-CM | POA: Insufficient documentation

## 2012-05-19 DIAGNOSIS — IMO0001 Reserved for inherently not codable concepts without codable children: Secondary | ICD-10-CM | POA: Insufficient documentation

## 2012-05-19 NOTE — MAU Provider Note (Signed)
Chief Complaint: Fever   First Provider Initiated Contact with Patient 05/20/12 0016     SUBJECTIVE HPI: Rachel Zamora is a 41 y.o. [id number] at 7 days S/P SVD who presents with chills yesterday,T max 103.3 today. Took ibuprofen 600mg  about 2100. Pain in left breast near nipple. Too painful to nurse on that side today. Small amount of bleeding from nipple, but no discharge. Initially denied HA, vision changes or epigastric pain, but when CNM returned to room, pt reported frontal HA 3/10 on pain scale today and seeing what looked like a waterfall yesterday. No Hx HTN.   Past Medical History  Diagnosis Date  . Abnormal Pap smear   . AMA (advanced maternal age) multigravida 35+   . Anemia   . HPV (human papilloma virus) anogenital infection   . Grand multiparity in labor and delivery, antepartum   . Gestational diabetes     diet controlled   OB History    Grav Para Term Preterm Abortions TAB SAB Ect Mult Living   12 9 9  3  0 2   9     # Outc Date GA Lbr Len/2nd Wgt Sex Del Anes PTL Lv   1 ABT 11/88           2 TRM 10/92 [redacted]w[redacted]d  7lb3oz(3.26kg) M SVD  No Yes   3 TRM 1994 [redacted]w[redacted]d  8lb2oz(3.685kg) F SVD   Yes   4 TRM 1996 [redacted]w[redacted]d  7lb11oz(3.487kg) F SVD   Yes   5 TRM 1998 [redacted]w[redacted]d  6lb4oz(2.835kg) F    Yes   6 TRM 2000 [redacted]w[redacted]d  7lb11oz(3.487kg) F    Yes   7 SAB 2002           8 SAB 2002           9 TRM 12/03 [redacted]w[redacted]d  7lb14oz(3.572kg) F SVD   Yes   10 TRM 3/06 [redacted]w[redacted]d  8lb13oz(3.997kg) M SVD   Yes   11 TRM 4/08 [redacted]w[redacted]d  7lb(3.175kg) F    Yes   12 TRM 10/13 [redacted]w[redacted]d 05:45 / 00:08 7lb12oz(3.515kg) F SVD EPI  Yes     Past Surgical History  Procedure Date  . Cholecystectomy   . Gynecologic cryosurgery   . Dilation and curettage of uterus   . Tab   . Hernia repair    History   Social History  . Marital Status: Married    Spouse Name: N/A    Number of Children: N/A  . Years of Education: N/A   Occupational History  . Not on file.   Social History Main Topics  . Smoking status: Never Smoker     . Smokeless tobacco: Never Used  . Alcohol Use: No  . Drug Use: No  . Sexually Active: Yes   Other Topics Concern  . Not on file   Social History Narrative  . No narrative on file   No current facility-administered medications on file prior to encounter.   Current Outpatient Prescriptions on File Prior to Encounter  Medication Sig Dispense Refill  . ibuprofen (ADVIL,MOTRIN) 600 MG tablet Take 1 tablet (600 mg total) by mouth every 6 (six) hours.  30 tablet  1  . oxyCODONE-acetaminophen (PERCOCET/ROXICET) 5-325 MG per tablet Take 1-2 tablets by mouth every 3 (three) hours as needed (moderate - severe pain).  30 tablet  0  . Prenatal Vit-Fe Fumarate-FA (PRENATAL MULTIVITAMIN) TABS Take 1 tablet by mouth every evening.      . tobramycin (TOBREX) 0.3 % ophthalmic solution Place  1 drop into the right eye every 4 (four) hours.  5 mL  0  . tobramycin (TOBREX) 0.3 % ophthalmic solution Place 1 drop into the right eye every 4 (four) hours.  5 mL  0   No Known Allergies  ROS: Pertinent items in HPI  OBJECTIVE Blood pressure 134/79, pulse 105, temperature 100 F (37.8 C), temperature source Oral, resp. rate 18, height 5\' 6"  (1.676 m), weight 90.175 kg (198 lb 12.8 oz), last menstrual period 11/25/2010, currently breastfeeding. Patient Vitals for the past 24 hrs:  BP Temp Temp src Pulse Resp Height Weight  05/20/12 0225 125/89 mmHg 100.5 F (38.1 C) Oral 114  18  - -  05/20/12 0140 134/79 mmHg - - 105  - - -  05/20/12 0130 135/80 mmHg - - 104  - - -  05/20/12 0120 124/72 mmHg - - 102  - - -  05/20/12 0110 141/82 mmHg - - 102  - - -  05/20/12 0100 136/71 mmHg - - 116  - - -  05/20/12 0050 141/87 mmHg - - 104  - - -  05/20/12 0040 135/87 mmHg - - 102  - - -  05/20/12 0030 137/80 mmHg - - 96  - - -  05/20/12 0020 139/90 mmHg - - 98  - - -  05/20/12 0010 133/86 mmHg - - 100  - - -  05/20/12 0000 139/84 mmHg - - 97  - - -  05/19/12 2350 139/97 mmHg - - 105  - - -  05/19/12 2334 137/88  mmHg 100 F (37.8 C) Oral 98  18  - -  05/19/12 2243 145/94 mmHg 99.4 F (37.4 C) - 98  20  5\' 6"  (1.676 m) 90.175 kg (198 lb 12.8 oz)    GENERAL: Well-developed, well-nourished female in no apparent distress.  HEENT: Normocephalic HEART: Mild tachycardia BREAST: Left breast firm, moderately engorged. Firm, tender, erythematous area from 2 o'clock to 4 o'clock at border of areola. Right breast soft, non-tender. RESP: normal effort ABDOMEN: Soft, non-tender. No CVAT EXTREMITIES: Nontender, 1+ edema NEURO: Alert and oriented, DTRs 2+ SPECULUM EXAM: deferred  LAB RESULTS Results for orders placed during the hospital encounter of 05/19/12 (from the past 24 hour(s))  URINALYSIS, ROUTINE W REFLEX MICROSCOPIC     Status: Abnormal   Collection Time   05/19/12 10:50 PM      Component Value Range   Color, Urine YELLOW  YELLOW   APPearance HAZY (*) CLEAR   Specific Gravity, Urine <1.005 (*) 1.005 - 1.030   pH 6.5  5.0 - 8.0   Glucose, UA NEGATIVE  NEGATIVE mg/dL   Hgb urine dipstick LARGE (*) NEGATIVE   Bilirubin Urine NEGATIVE  NEGATIVE   Ketones, ur NEGATIVE  NEGATIVE mg/dL   Protein, ur NEGATIVE  NEGATIVE mg/dL   Urobilinogen, UA 0.2  0.0 - 1.0 mg/dL   Nitrite NEGATIVE  NEGATIVE   Leukocytes, UA MODERATE (*) NEGATIVE  URINE MICROSCOPIC-ADD ON     Status: Abnormal   Collection Time   05/19/12 10:50 PM      Component Value Range   Squamous Epithelial / LPF MANY (*) RARE   WBC, UA 3-6  <3 WBC/hpf   RBC / HPF 21-50  <3 RBC/hpf   Bacteria, UA MANY (*) RARE   Urine-Other MUCOUS PRESENT    CBC WITH DIFFERENTIAL     Status: Abnormal   Collection Time   05/19/12 11:43 PM      Component Value Range  WBC 19.2 (*) 4.0 - 10.5 K/uL   RBC 4.59  3.87 - 5.11 MIL/uL   Hemoglobin 13.6  12.0 - 15.0 g/dL   HCT 16.1  09.6 - 04.5 %   MCV 85.0  78.0 - 100.0 fL   MCH 29.6  26.0 - 34.0 pg   MCHC 34.9  30.0 - 36.0 g/dL   RDW 40.9  81.1 - 91.4 %   Platelets 295  150 - 400 K/uL   Neutrophils  Relative 87 (*) 43 - 77 %   Neutro Abs 16.7 (*) 1.7 - 7.7 K/uL   Lymphocytes Relative 7 (*) 12 - 46 %   Lymphs Abs 1.3  0.7 - 4.0 K/uL   Monocytes Relative 6  3 - 12 %   Monocytes Absolute 1.1 (*) 0.1 - 1.0 K/uL   Eosinophils Relative 0  0 - 5 %   Eosinophils Absolute 0.0  0.0 - 0.7 K/uL   Basophils Relative 0  0 - 1 %   Basophils Absolute 0.0  0.0 - 0.1 K/uL  COMPREHENSIVE METABOLIC PANEL     Status: Abnormal   Collection Time   05/19/12 11:55 PM      Component Value Range   Sodium 134 (*) 135 - 145 mEq/L   Potassium 3.8  3.5 - 5.1 mEq/L   Chloride 101  96 - 112 mEq/L   CO2 20  19 - 32 mEq/L   Glucose, Bld 108 (*) 70 - 99 mg/dL   BUN 9  6 - 23 mg/dL   Creatinine, Ser 7.82  0.50 - 1.10 mg/dL   Calcium 9.0  8.4 - 95.6 mg/dL   Total Protein 6.8  6.0 - 8.3 g/dL   Albumin 2.9 (*) 3.5 - 5.2 g/dL   AST 23  0 - 37 U/L   ALT 28  0 - 35 U/L   Alkaline Phosphatase 100  39 - 117 U/L   Total Bilirubin 0.3  0.3 - 1.2 mg/dL   GFR calc non Af Amer >90  >90 mL/min   GFR calc Af Amer >90  >90 mL/min    IMAGING No results found.  MAU COURSE  ASSESSMENT 1. Mastitis, associated with childbirth   2. Hypertension w/out proteinuria, postpartum condition or complication    PLAN Discharge home Follow-up Information    Follow up with BOVARD,JODY, MD. (As needed if no improvment in 48-72 hours or for headache, vision changes or upper abdominal pain. )    Contact information:   [address] [place] [address] [phone]           Medication List     As of 05/20/2012  2:26 AM    STOP taking these medications         calcium carbonate 500 MG chewable tablet   Commonly known as: TUMS - dosed in mg elemental calcium      TAKE these medications         cephALEXin 500 MG capsule   Commonly known as: KEFLEX   Take 1 capsule (500 mg total) by mouth 4 (four) times daily.      ibuprofen 600 MG tablet   Commonly known as: ADVIL,MOTRIN   Take 1 tablet (600 mg  total) by mouth every 6 (six) hours.      oxyCODONE-acetaminophen 5-325 MG per tablet   Commonly known as: PERCOCET/ROXICET   Take 1-2 tablets by mouth every 3 (three) hours as needed (moderate - severe pain).  prenatal multivitamin Tabs   Take 1 tablet by mouth every evening.      tobramycin 0.3 % ophthalmic solution   Commonly known as: TOBREX   Place 1 drop into the right eye every 4 (four) hours.      tobramycin 0.3 % ophthalmic solution   Commonly known as: TOBREX   Place 1 drop into the right eye every 4 (four) hours.         Dorathy Kinsman, CNM 05/20/2012  2:26 AM

## 2012-05-19 NOTE — MAU Note (Signed)
Pt report fever today, chills through last night and today. Pt states that her L nipple is sore. Bleeding red, but pt states is normal.

## 2012-05-19 NOTE — MAU Note (Signed)
Delivered vaginally 10/18. No problems. Had chills yest. But went away. Today have had chills and fever of 103.3, 102.1, and 101.5. Took ibuprofen 600mg  about 2100

## 2012-05-20 DIAGNOSIS — O9122 Nonpurulent mastitis associated with the puerperium: Secondary | ICD-10-CM

## 2012-05-20 LAB — COMPREHENSIVE METABOLIC PANEL
ALT: 28 U/L (ref 0–35)
BUN: 9 mg/dL (ref 6–23)
CO2: 20 mEq/L (ref 19–32)
Calcium: 9 mg/dL (ref 8.4–10.5)
GFR calc Af Amer: >90 mL/min (ref >90)
GFR calc non Af Amer: >90 mL/min (ref >90)
Glucose, Bld: 108 mg/dL — ABNORMAL HIGH (ref 70–99)
Sodium: 134 mEq/L — ABNORMAL LOW (ref 135–145)
Total Protein: 6.8 g/dL (ref 6.0–8.3)

## 2012-05-20 LAB — CBC WITH DIFFERENTIAL/PLATELET
Eosinophils Absolute: 0 10*3/uL (ref 0.0–0.7)
Eosinophils Relative: 0 % (ref 0–5)
HCT: 39 % (ref 36.0–46.0)
Hemoglobin: 13.6 g/dL (ref 12.0–15.0)
Lymphocytes Relative: 7 % — ABNORMAL LOW (ref 12–46)
Lymphs Abs: 1.3 10*3/uL (ref 0.7–4.0)
MCH: 29.6 pg (ref 26.0–34.0)
MCV: 85 fL (ref 78.0–100.0)
Monocytes Absolute: 1.1 10*3/uL — ABNORMAL HIGH (ref 0.1–1.0)
Monocytes Relative: 6 % (ref 3–12)
Platelets: 295 10*3/uL (ref 150–400)
RBC: 4.59 MIL/uL (ref 3.87–5.11)
WBC: 19.2 10*3/uL — ABNORMAL HIGH (ref 4.0–10.5)

## 2012-05-20 LAB — URINALYSIS, ROUTINE W REFLEX MICROSCOPIC
Bilirubin Urine: NEGATIVE
Ketones, ur: NEGATIVE mg/dL
Nitrite: NEGATIVE
Specific Gravity, Urine: <1.005 — ABNORMAL LOW (ref 1.005–1.030)
Urobilinogen, UA: 0.2 mg/dL (ref 0.0–1.0)

## 2012-05-20 LAB — URINE MICROSCOPIC-ADD ON

## 2012-05-20 MED ORDER — CEPHALEXIN 500 MG PO CAPS: 500.0000 mg | ORAL_CAPSULE | Freq: Four times a day (QID) | ORAL | Status: DC

## 2012-05-20 MED ORDER — CEPHALEXIN 250 MG/5ML PO SUSR: 500.0000 mg | Freq: Once | ORAL | Status: DC

## 2012-05-20 MED ORDER — CEPHALEXIN 500 MG PO CAPS
500.0000 mg | ORAL_CAPSULE | Freq: Once | ORAL | Status: AC
  Administered 2012-05-20: 500 mg via ORAL
  Filled 2012-05-20: qty 1

## 2012-06-14 ENCOUNTER — Encounter (HOSPITAL_COMMUNITY)
Admission: RE | Admit: 2012-06-14 | Discharge: 2012-06-14 | Disposition: A | Discharge status: Home / Self Care | Payer: BC Managed Care – PPO | Source: Ambulatory Visit | Attending: Obstetrics and Gynecology | Admitting: Obstetrics and Gynecology

## 2012-06-14 DIAGNOSIS — O923 Agalactia: Secondary | ICD-10-CM | POA: Insufficient documentation

## 2012-07-14 ENCOUNTER — Encounter (HOSPITAL_COMMUNITY)
Admission: RE | Admit: 2012-07-14 | Discharge: 2012-07-14 | Disposition: A | Discharge status: Home / Self Care | Payer: BC Managed Care – PPO | Source: Ambulatory Visit | Attending: Obstetrics and Gynecology | Admitting: Obstetrics and Gynecology

## 2012-07-14 DIAGNOSIS — O923 Agalactia: Secondary | ICD-10-CM | POA: Insufficient documentation

## 2012-08-14 ENCOUNTER — Encounter (HOSPITAL_COMMUNITY)
Admission: RE | Admit: 2012-08-14 | Discharge: 2012-08-14 | Disposition: A | Discharge status: Home / Self Care | Payer: BC Managed Care – PPO | Source: Ambulatory Visit | Attending: Obstetrics and Gynecology | Admitting: Obstetrics and Gynecology

## 2012-08-14 DIAGNOSIS — O923 Agalactia: Secondary | ICD-10-CM | POA: Insufficient documentation

## 2012-09-14 ENCOUNTER — Encounter (HOSPITAL_COMMUNITY)
Admission: RE | Admit: 2012-09-14 | Discharge: 2012-09-14 | Disposition: A | Discharge status: Home / Self Care | Payer: BC Managed Care – PPO | Source: Ambulatory Visit | Attending: Obstetrics and Gynecology | Admitting: Obstetrics and Gynecology

## 2012-09-14 DIAGNOSIS — O923 Agalactia: Secondary | ICD-10-CM | POA: Insufficient documentation

## 2012-10-13 ENCOUNTER — Encounter (HOSPITAL_COMMUNITY)
Admission: RE | Admit: 2012-10-13 | Discharge: 2012-10-13 | Disposition: A | Discharge status: Home / Self Care | Payer: BC Managed Care – PPO | Source: Ambulatory Visit | Attending: Obstetrics and Gynecology | Admitting: Obstetrics and Gynecology

## 2012-10-13 DIAGNOSIS — O923 Agalactia: Secondary | ICD-10-CM | POA: Insufficient documentation

## 2012-11-13 ENCOUNTER — Encounter (HOSPITAL_COMMUNITY)
Admission: RE | Admit: 2012-11-13 | Discharge: 2012-11-13 | Disposition: A | Discharge status: Home / Self Care | Payer: BC Managed Care – PPO | Source: Ambulatory Visit | Attending: Obstetrics and Gynecology | Admitting: Obstetrics and Gynecology

## 2012-11-13 DIAGNOSIS — O923 Agalactia: Secondary | ICD-10-CM | POA: Insufficient documentation

## 2012-12-14 ENCOUNTER — Encounter (HOSPITAL_COMMUNITY)
Admission: RE | Admit: 2012-12-14 | Discharge: 2012-12-14 | Disposition: A | Discharge status: Home / Self Care | Payer: BC Managed Care – PPO | Source: Ambulatory Visit | Attending: Obstetrics and Gynecology | Admitting: Obstetrics and Gynecology

## 2012-12-14 DIAGNOSIS — O923 Agalactia: Secondary | ICD-10-CM | POA: Insufficient documentation

## 2013-01-14 ENCOUNTER — Encounter (HOSPITAL_COMMUNITY)
Admission: RE | Admit: 2013-01-14 | Discharge: 2013-01-14 | Disposition: A | Discharge status: Home / Self Care | Payer: BC Managed Care – PPO | Source: Ambulatory Visit | Attending: Obstetrics and Gynecology | Admitting: Obstetrics and Gynecology

## 2013-01-14 DIAGNOSIS — O923 Agalactia: Secondary | ICD-10-CM | POA: Insufficient documentation

## 2013-02-13 ENCOUNTER — Encounter (HOSPITAL_COMMUNITY)
Admission: RE | Admit: 2013-02-13 | Discharge: 2013-02-13 | Disposition: A | Discharge status: Home / Self Care | Payer: BC Managed Care – PPO | Source: Ambulatory Visit | Attending: Obstetrics and Gynecology | Admitting: Obstetrics and Gynecology

## 2013-02-13 DIAGNOSIS — O923 Agalactia: Secondary | ICD-10-CM | POA: Insufficient documentation

## 2013-04-16 ENCOUNTER — Encounter (HOSPITAL_COMMUNITY)
Admission: RE | Admit: 2013-04-16 | Discharge: 2013-04-16 | Disposition: A | Discharge status: Home / Self Care | Payer: BC Managed Care – PPO | Source: Ambulatory Visit | Attending: Obstetrics and Gynecology | Admitting: Obstetrics and Gynecology

## 2013-04-16 DIAGNOSIS — O923 Agalactia: Secondary | ICD-10-CM | POA: Insufficient documentation

## 2013-05-17 ENCOUNTER — Encounter (HOSPITAL_COMMUNITY)
Admission: RE | Admit: 2013-05-17 | Discharge: 2013-05-17 | Disposition: A | Discharge status: Home / Self Care | Payer: BC Managed Care – PPO | Source: Ambulatory Visit | Attending: Obstetrics and Gynecology | Admitting: Obstetrics and Gynecology

## 2013-05-17 DIAGNOSIS — O923 Agalactia: Secondary | ICD-10-CM | POA: Insufficient documentation

## 2013-06-18 ENCOUNTER — Encounter (HOSPITAL_COMMUNITY)
Admission: RE | Admit: 2013-06-18 | Discharge: 2013-06-18 | Disposition: A | Discharge status: Home / Self Care | Payer: BC Managed Care – PPO | Source: Ambulatory Visit | Attending: Obstetrics and Gynecology | Admitting: Obstetrics and Gynecology

## 2013-06-18 DIAGNOSIS — O923 Agalactia: Secondary | ICD-10-CM | POA: Insufficient documentation

## 2013-07-20 ENCOUNTER — Encounter (HOSPITAL_COMMUNITY)
Admission: RE | Admit: 2013-07-20 | Discharge: 2013-07-20 | Disposition: A | Discharge status: Home / Self Care | Payer: BC Managed Care – PPO | Source: Ambulatory Visit | Attending: Obstetrics and Gynecology | Admitting: Obstetrics and Gynecology

## 2013-07-20 ENCOUNTER — Emergency Department (INDEPENDENT_AMBULATORY_CARE_PROVIDER_SITE_OTHER)
Admission: EM | Admit: 2013-07-20 | Discharge: 2013-07-20 | Disposition: A | Discharge status: Home / Self Care | Payer: BC Managed Care – PPO | Source: Home / Self Care

## 2013-07-20 ENCOUNTER — Encounter (HOSPITAL_COMMUNITY): Payer: Self-pay | Admitting: Emergency Medicine

## 2013-07-20 DIAGNOSIS — O923 Agalactia: Secondary | ICD-10-CM | POA: Insufficient documentation

## 2013-07-20 DIAGNOSIS — B9789 Other viral agents as the cause of diseases classified elsewhere: Secondary | ICD-10-CM

## 2013-07-20 DIAGNOSIS — B349 Viral infection, unspecified: Secondary | ICD-10-CM

## 2013-07-20 DIAGNOSIS — J069 Acute upper respiratory infection, unspecified: Secondary | ICD-10-CM

## 2013-07-20 NOTE — ED Notes (Signed)
Fever, chills, throat pain, chest pain, low grade temperature, and non-productive cough

## 2013-07-20 NOTE — ED Provider Notes (Signed)
CSN: [id number]     Arrival date & time 07/20/13  1519 History   First MD Initiated Contact with Patient 07/20/13 1809     Chief Complaint  Patient presents with  . Fever  . Generalized Body Aches   (Consider location/radiation/quality/duration/timing/severity/associated sxs/prior Treatment) HPI Comments: 42 year old female presents with body achiness, upper respiratory congestion, cough, sore throat and a low-grade fever of 99.9 at home. She is not taking any medications for her symptoms.   Past Medical History  Diagnosis Date  . Abnormal Pap smear   . AMA (advanced maternal age) multigravida 35+   . Anemia   . HPV (human papilloma virus) anogenital infection   . Grand multiparity in labor and delivery, antepartum   . Gestational diabetes     diet controlled   Past Surgical History  Procedure Laterality Date  . Cholecystectomy    . Gynecologic cryosurgery    . Dilation and curettage of uterus    . Tab    . Hernia repair     Family History  Problem Relation Age of Onset  . Diabetes Father    History  Substance Use Topics  . Smoking status: Never Smoker   . Smokeless tobacco: Never Used  . Alcohol Use: No   OB History   Grav Para Term Preterm Abortions TAB SAB Ect Mult Living   12 9 9  3  0 2   9     Review of Systems  Constitutional: Positive for fever and activity change. Negative for chills, appetite change and fatigue.  HENT: Positive for congestion, postnasal drip, rhinorrhea and sore throat. Negative for facial swelling.   Eyes: Negative.   Respiratory: Positive for cough. Negative for shortness of breath and wheezing.   Cardiovascular: Negative.        Chest wall pain associated with coughing.  Gastrointestinal: Negative.   Genitourinary: Negative.   Musculoskeletal: Negative for neck pain and neck stiffness.  Skin: Negative for pallor and rash.  Neurological: Negative.     Allergies  Review of patient's allergies indicates no known allergies.  Home  Medications   Current Outpatient Rx  Name  Route  Sig  Dispense  Refill  . cephALEXin (KEFLEX) 500 MG capsule   Oral   Take 1 capsule (500 mg total) by mouth 4 (four) times daily.   40 capsule   0   . ibuprofen (ADVIL,MOTRIN) 600 MG tablet   Oral   Take 1 tablet (600 mg total) by mouth every 6 (six) hours.   30 tablet   1   . oxyCODONE-acetaminophen (PERCOCET/ROXICET) 5-325 MG per tablet   Oral   Take 1-2 tablets by mouth every 3 (three) hours as needed (moderate - severe pain).   30 tablet   0   . Prenatal Vit-Fe Fumarate-FA (PRENATAL MULTIVITAMIN) TABS   Oral   Take 1 tablet by mouth every evening.         . tobramycin (TOBREX) 0.3 % ophthalmic solution   Right Eye   Place 1 drop into the right eye every 4 (four) hours.   5 mL   0   . tobramycin (TOBREX) 0.3 % ophthalmic solution   Right Eye   Place 1 drop into the right eye every 4 (four) hours.   5 mL   0    BP 118/67  Pulse 88  Temp(Src) 98.7 F (37.1 C) (Oral)  Resp 16  SpO2 100%  LMP 07/04/2013  Breastfeeding? Yes Physical Exam  Nursing note and vitals  reviewed. Constitutional: She is oriented to person, place, and time. She appears well-developed and well-nourished. No distress.  HENT:  Right Ear: External ear normal.  Left Ear: External ear normal.  Mouth/Throat: No oropharyngeal exudate.  Oropharynx with erythema and a copious amount of thick slightly discolored PND. No exudates.  Eyes: Conjunctivae and EOM are normal.  Neck: Normal range of motion. Neck supple.  Cardiovascular: Normal rate, regular rhythm and normal heart sounds.   Pulmonary/Chest: Effort normal and breath sounds normal. No respiratory distress. She has no wheezes. She has no rales. She exhibits no tenderness.  Abdominal: Soft. She exhibits no distension. There is no tenderness.  Musculoskeletal: Normal range of motion. She exhibits no edema.  Lymphadenopathy:    She has no cervical adenopathy.  Neurological: She is alert  and oriented to person, place, and time.  Skin: Skin is warm and dry. No rash noted.  Psychiatric: She has a normal mood and affect.    ED Course  Procedures (including critical care time) Labs Review Labs Reviewed - No data to display Imaging Review No results found.    MDM   1. URI (upper respiratory infection)   2. Viral syndrome      Alka-Seltzer cold plus Robitussin-DM as needed Drink plenty of fluids stay well hydrated Instructions on URI and viral syndromes. Bowel your PCP as needed next week  Hayden Rasmussen, NP 07/20/13 [phone]

## 2013-07-22 NOTE — ED Provider Notes (Signed)
Medical screening examination/treatment/procedure(s) were performed by a resident physician or non-physician practitioner and as the supervising physician I was immediately available for consultation/collaboration.  Luverne Zerkle, MD    Lexton Hidalgo S Harneet Noblett, MD 07/22/13 0850 

## 2013-07-27 ENCOUNTER — Encounter (HOSPITAL_COMMUNITY): Payer: BC Managed Care – PPO

## 2013-08-19 ENCOUNTER — Encounter (HOSPITAL_COMMUNITY)
Admission: RE | Admit: 2013-08-19 | Discharge: 2013-08-19 | Disposition: A | Discharge status: Home / Self Care | Payer: BC Managed Care – PPO | Source: Ambulatory Visit | Attending: Obstetrics and Gynecology | Admitting: Obstetrics and Gynecology

## 2013-08-19 DIAGNOSIS — O923 Agalactia: Secondary | ICD-10-CM | POA: Insufficient documentation

## 2013-09-19 ENCOUNTER — Encounter (HOSPITAL_COMMUNITY)
Admission: RE | Admit: 2013-09-19 | Discharge: 2013-09-19 | Disposition: A | Discharge status: Home / Self Care | Payer: BC Managed Care – PPO | Source: Ambulatory Visit | Attending: Obstetrics and Gynecology | Admitting: Obstetrics and Gynecology

## 2013-09-19 DIAGNOSIS — O923 Agalactia: Secondary | ICD-10-CM | POA: Insufficient documentation

## 2014-05-27 ENCOUNTER — Encounter (HOSPITAL_COMMUNITY): Payer: Self-pay | Admitting: Emergency Medicine

## 2016-01-01 ENCOUNTER — Other Ambulatory Visit: Payer: Self-pay | Admitting: Nurse Practitioner

## 2016-01-01 DIAGNOSIS — N6320 Unspecified lump in the left breast, unspecified quadrant: Secondary | ICD-10-CM

## 2016-01-01 DIAGNOSIS — N6325 Unspecified lump in the left breast, overlapping quadrants: Secondary | ICD-10-CM

## 2016-01-01 DIAGNOSIS — N6323 Unspecified lump in the left breast, lower outer quadrant: Secondary | ICD-10-CM

## 2016-01-07 ENCOUNTER — Other Ambulatory Visit: Payer: Self-pay

## 2016-01-22 ENCOUNTER — Other Ambulatory Visit: Payer: Self-pay

## 2016-02-03 ENCOUNTER — Other Ambulatory Visit: Payer: Self-pay

## 2016-02-09 ENCOUNTER — Ambulatory Visit
Admission: RE | Admit: 2016-02-09 | Discharge: 2016-02-09 | Disposition: A | Discharge status: Home / Self Care | Payer: Managed Care, Other (non HMO) | Source: Ambulatory Visit | Attending: Internal Medicine | Admitting: Internal Medicine

## 2016-02-09 DIAGNOSIS — N6320 Unspecified lump in the left breast, unspecified quadrant: Secondary | ICD-10-CM

## 2016-02-09 DIAGNOSIS — N6323 Unspecified lump in the left breast, lower outer quadrant: Secondary | ICD-10-CM

## 2016-02-09 DIAGNOSIS — N6325 Unspecified lump in the left breast, overlapping quadrants: Secondary | ICD-10-CM

## 2017-07-14 ENCOUNTER — Other Ambulatory Visit: Payer: Self-pay | Admitting: Obstetrics and Gynecology

## 2017-07-18 ENCOUNTER — Other Ambulatory Visit: Payer: Self-pay | Admitting: Obstetrics and Gynecology

## 2017-07-18 DIAGNOSIS — R928 Other abnormal and inconclusive findings on diagnostic imaging of breast: Secondary | ICD-10-CM

## 2017-07-22 ENCOUNTER — Other Ambulatory Visit: Payer: Self-pay | Admitting: Obstetrics and Gynecology

## 2017-07-22 ENCOUNTER — Ambulatory Visit
Admission: RE | Admit: 2017-07-22 | Discharge: 2017-07-22 | Disposition: A | Discharge status: Home / Self Care | Payer: 59 | Source: Ambulatory Visit | Attending: Obstetrics and Gynecology | Admitting: Obstetrics and Gynecology

## 2017-07-22 ENCOUNTER — Ambulatory Visit
Admission: RE | Admit: 2017-07-22 | Discharge: 2017-07-22 | Disposition: A | Discharge status: Home / Self Care | Payer: Managed Care, Other (non HMO) | Source: Ambulatory Visit | Attending: Obstetrics and Gynecology | Admitting: Obstetrics and Gynecology

## 2017-07-22 DIAGNOSIS — R928 Other abnormal and inconclusive findings on diagnostic imaging of breast: Secondary | ICD-10-CM

## 2018-01-20 ENCOUNTER — Other Ambulatory Visit: Payer: 59

## 2018-03-06 ENCOUNTER — Other Ambulatory Visit: Payer: Self-pay | Admitting: Orthopedic Surgery

## 2018-03-09 ENCOUNTER — Encounter (HOSPITAL_COMMUNITY): Payer: Self-pay

## 2018-03-09 ENCOUNTER — Ambulatory Visit (HOSPITAL_COMMUNITY)
Admission: RE | Admit: 2018-03-09 | Discharge: 2018-03-09 | Disposition: A | Discharge status: Home / Self Care | Payer: 59 | Source: Ambulatory Visit | Attending: Orthopedic Surgery | Admitting: Orthopedic Surgery

## 2018-03-09 ENCOUNTER — Encounter (HOSPITAL_COMMUNITY)
Admission: RE | Admit: 2018-03-09 | Discharge: 2018-03-09 | Disposition: A | Discharge status: Home / Self Care | Payer: 59 | Source: Ambulatory Visit | Attending: Orthopedic Surgery | Admitting: Orthopedic Surgery

## 2018-03-09 DIAGNOSIS — Z01818 Encounter for other preprocedural examination: Secondary | ICD-10-CM

## 2018-03-09 DIAGNOSIS — Z01812 Encounter for preprocedural laboratory examination: Secondary | ICD-10-CM | POA: Insufficient documentation

## 2018-03-09 DIAGNOSIS — Z01810 Encounter for preprocedural cardiovascular examination: Secondary | ICD-10-CM | POA: Insufficient documentation

## 2018-03-09 DIAGNOSIS — I493 Ventricular premature depolarization: Secondary | ICD-10-CM | POA: Diagnosis not present

## 2018-03-09 HISTORY — DX: Personal history of other diseases of the digestive system: Z87.19

## 2018-03-09 LAB — URINALYSIS, ROUTINE W REFLEX MICROSCOPIC
Bilirubin Urine: NEGATIVE
GLUCOSE, UA: NEGATIVE mg/dL
Ketones, ur: NEGATIVE mg/dL
Leukocytes, UA: NEGATIVE
NITRITE: NEGATIVE
Protein, ur: NEGATIVE mg/dL
SPECIFIC GRAVITY, URINE: 1.012 (ref 1.005–1.030)
pH: 6 (ref 5.0–8.0)

## 2018-03-09 LAB — COMPREHENSIVE METABOLIC PANEL
ALK PHOS: 63 U/L (ref 38–126)
ALT: 18 U/L (ref 0–44)
AST: 20 U/L (ref 15–41)
Albumin: 3.8 g/dL (ref 3.5–5.0)
Anion gap: 7 (ref 5–15)
BILIRUBIN TOTAL: 0.5 mg/dL (ref 0.3–1.2)
BUN: 6 mg/dL (ref 6–20)
CALCIUM: 8.9 mg/dL (ref 8.9–10.3)
CO2: 24 mmol/L (ref 22–32)
CREATININE: 0.66 mg/dL (ref 0.44–1.00)
Chloride: 107 mmol/L (ref 98–111)
GFR calc non Af Amer: >60 mL/min (ref >60)
GLUCOSE: 79 mg/dL (ref 70–99)
Potassium: 3.8 mmol/L (ref 3.5–5.1)
Sodium: 138 mmol/L (ref 135–145)
Total Protein: 7 g/dL (ref 6.5–8.1)

## 2018-03-09 LAB — APTT: aPTT: 30 seconds (ref 24–36)

## 2018-03-09 LAB — CBC WITH DIFFERENTIAL/PLATELET
ABS IMMATURE GRANULOCYTES: 0 10*3/uL (ref 0.0–0.1)
BASOS ABS: 0 10*3/uL (ref 0.0–0.1)
Basophils Relative: 1 %
Eosinophils Absolute: 0.1 10*3/uL (ref 0.0–0.7)
Eosinophils Relative: 1 %
HEMATOCRIT: 40.4 % (ref 36.0–46.0)
HEMOGLOBIN: 13 g/dL (ref 12.0–15.0)
Immature Granulocytes: 0 %
LYMPHS ABS: 2 10*3/uL (ref 0.7–4.0)
LYMPHS PCT: 34 %
MCH: 29 pg (ref 26.0–34.0)
MCHC: 32.2 g/dL (ref 30.0–36.0)
MCV: 90 fL (ref 78.0–100.0)
MONO ABS: 0.5 10*3/uL (ref 0.1–1.0)
MONOS PCT: 8 %
NEUTROS ABS: 3.4 10*3/uL (ref 1.7–7.7)
Neutrophils Relative %: 56 %
Platelets: 347 10*3/uL (ref 150–400)
RBC: 4.49 MIL/uL (ref 3.87–5.11)
RDW: 13.4 % (ref 11.5–15.5)
WBC: 5.9 10*3/uL (ref 4.0–10.5)

## 2018-03-09 LAB — PROTIME-INR
INR: 1.08
Prothrombin Time: 13.9 seconds (ref 11.4–15.2)

## 2018-03-09 LAB — SURGICAL PCR SCREEN
MRSA, PCR: NEGATIVE
Staphylococcus aureus: NEGATIVE

## 2018-03-09 LAB — HEMOGLOBIN A1C
Hgb A1c MFr Bld: 5.4 % (ref 4.8–5.6)
Mean Plasma Glucose: 108.28 mg/dL

## 2018-03-09 NOTE — Pre-Procedure Instructions (Signed)
Rachel Zamora  03/09/2018      [id number]- [place], [place] - [address] [place] [id number] Phone: [phone] Fax: [phone]    Your procedure is scheduled on 03/15/18.  [facility] at 1030 A.M.  Call this number if you have problems the morning of surgery:  [phone]   Remember:     Take these medicines the morning of surgery with A SIP OF WATER ----Renae GlossNEURONTIN,ULTRAM    Do not wear jewelry, make-up or nail polish.  Do not wear lotions, powders, or perfumes, or deodorant.  Do not shave 48 hours prior to surgery.  Men may shave face and neck.  Do not bring valuables to the hospital.  [facility] is not responsible for any belongings or valuables.  Contacts, dentures or bridgework may not be worn into surgery.  Leave your suitcase in the car.  After surgery it may be brought to your room.  For patients admitted to the hospital, discharge time will be determined by your treatment team.  Patients discharged the day of surgery will not be allowed to drive home.   Name and phone number of your driver:   Do not take any aspirin,anti-inflammatories,vitamins,or herbal supplements 5-7 days prior to surgery. Special instructions:  [facility] - Preparing for Surgery  Before surgery, you can play an important role.  Because skin is not sterile, your skin needs to be as free of germs as possible.  You can reduce the number of germs on you skin by washing with CHG (chlorahexidine gluconate) soap before surgery.  CHG is an antiseptic cleaner which kills germs and bonds with the skin to continue killing germs even after washing.  Oral Hygiene is also important in reducing the risk of infection.  Remember to brush your teeth with your regular toothpaste the morning of surgery.  Please DO NOT use if you have an allergy to CHG or antibacterial soaps.  If your skin becomes reddened/irritated stop using the CHG and  inform your nurse when you arrive at Short Stay.  Do not shave (including legs and underarms) for at least 48 hours prior to the first CHG shower.  You may shave your face.  Please follow these instructions carefully:   1.  Shower with CHG Soap the night before surgery and the morning of Surgery.  2.  If you choose to wash your hair, wash your hair first as usual with your normal shampoo.  3.  After you shampoo, rinse your hair and body thoroughly to remove the shampoo. 4.  Use CHG as you would any other liquid soap.  You can apply chg directly to the skin and wash gently with a      scrungie or washcloth.           5.  Apply the CHG Soap to your body ONLY FROM THE NECK DOWN.   Do not use on open wounds or open sores. Avoid contact with your eyes, ears, mouth and genitals (private parts).  Wash genitals (private parts) with your normal soap.  6.  Wash thoroughly, paying special attention to the area where your surgery will be performed.  7.  Thoroughly rinse your body with warm water from the neck down.  8.  DO NOT shower/wash with your normal soap after using and rinsing off the CHG Soap.  9.  Pat yourself dry with a clean towel.  10.  Wear clean pajamas.            11.  Place clean sheets on your bed the night of your first shower and do not sleep with pets.  Day of Surgery  Do not apply any lotions/deoderants the morning of surgery.   Please wear clean clothes to the hospital/surgery center. Remember to brush your teeth with toothpaste.    Please read over the following fact sheets that you were given. MRSA Information

## 2018-03-15 ENCOUNTER — Ambulatory Visit (HOSPITAL_COMMUNITY): Admission: RE | Admit: 2018-03-15 | Payer: 59 | Source: Ambulatory Visit | Admitting: Orthopedic Surgery

## 2018-03-15 ENCOUNTER — Encounter (HOSPITAL_COMMUNITY): Admission: RE | Payer: Self-pay | Source: Ambulatory Visit

## 2018-03-15 SURGERY — ANTERIOR CERVICAL DECOMPRESSION/DISCECTOMY FUSION 4 LEVELS
Anesthesia: General

## 2018-03-20 LAB — TYPE AND SCREEN
ABO/RH(D): A POS
Antibody Screen: POSITIVE
UNIT DIVISION: 0
Unit division: 0

## 2018-03-20 LAB — BPAM RBC
BLOOD PRODUCT EXPIRATION DATE: 201909082359
Blood Product Expiration Date: 201909082359
Unit Type and Rh: 6200
Unit Type and Rh: 6200

## 2018-04-07 ENCOUNTER — Other Ambulatory Visit: Payer: Self-pay | Admitting: Orthopedic Surgery

## 2018-04-12 ENCOUNTER — Encounter (HOSPITAL_COMMUNITY): Payer: Self-pay | Admitting: *Deleted

## 2018-04-12 ENCOUNTER — Other Ambulatory Visit: Payer: Self-pay

## 2018-04-12 NOTE — Progress Notes (Addendum)
Pt denies SOB, chest pain, and being under the care of a cardiologist. Pt denies having a stress test , echo and cardiac cath. Pt denies recent labs. Pt made aware to stop taking Aspirin, vitamins, fish oil and herbal medications. Do not take any NSAIDs ie: Ibuprofen, Advil, Naproxen (Aleve), Motrin, Meloxicam (Mobic), BC and Goody Powder. Pt verbalized understanding of all pre-op instructions.

## 2018-04-13 ENCOUNTER — Encounter (HOSPITAL_COMMUNITY)
Admission: RE | Disposition: A | Discharge status: Home / Self Care | Payer: Self-pay | Source: Ambulatory Visit | Attending: Orthopedic Surgery

## 2018-04-13 ENCOUNTER — Encounter (HOSPITAL_COMMUNITY): Payer: Self-pay | Admitting: General Practice

## 2018-04-13 ENCOUNTER — Ambulatory Visit (HOSPITAL_COMMUNITY): Payer: 59 | Admitting: Anesthesiology

## 2018-04-13 ENCOUNTER — Ambulatory Visit (HOSPITAL_COMMUNITY): Payer: 59

## 2018-04-13 ENCOUNTER — Ambulatory Visit (HOSPITAL_COMMUNITY)
Admission: RE | Admit: 2018-04-13 | Discharge: 2018-04-14 | Disposition: A | Discharge status: Home / Self Care | Payer: 59 | Source: Ambulatory Visit | Attending: Orthopedic Surgery | Admitting: Orthopedic Surgery

## 2018-04-13 DIAGNOSIS — M4802 Spinal stenosis, cervical region: Secondary | ICD-10-CM | POA: Insufficient documentation

## 2018-04-13 DIAGNOSIS — Z79899 Other long term (current) drug therapy: Secondary | ICD-10-CM | POA: Insufficient documentation

## 2018-04-13 DIAGNOSIS — M5412 Radiculopathy, cervical region: Secondary | ICD-10-CM | POA: Diagnosis present

## 2018-04-13 DIAGNOSIS — M40202 Unspecified kyphosis, cervical region: Secondary | ICD-10-CM | POA: Diagnosis not present

## 2018-04-13 DIAGNOSIS — Z419 Encounter for procedure for purposes other than remedying health state, unspecified: Secondary | ICD-10-CM

## 2018-04-13 HISTORY — DX: Headache: R51

## 2018-04-13 HISTORY — DX: Headache, unspecified: R51.9

## 2018-04-13 HISTORY — PX: ANTERIOR CERVICAL DECOMPRESSION/DISCECTOMY FUSION 4 LEVELS: SHX5556

## 2018-04-13 HISTORY — DX: Radiculopathy, cervical region: M54.12

## 2018-04-13 LAB — CBC WITH DIFFERENTIAL/PLATELET
ABS IMMATURE GRANULOCYTES: 0 10*3/uL (ref 0.0–0.1)
Basophils Absolute: 0.1 10*3/uL (ref 0.0–0.1)
Basophils Relative: 1 %
Eosinophils Absolute: 0.1 10*3/uL (ref 0.0–0.7)
Eosinophils Relative: 1 %
HCT: 41.1 % (ref 36.0–46.0)
HEMOGLOBIN: 13.4 g/dL (ref 12.0–15.0)
IMMATURE GRANULOCYTES: 0 %
LYMPHS PCT: 35 %
Lymphs Abs: 2.6 10*3/uL (ref 0.7–4.0)
MCH: 29 pg (ref 26.0–34.0)
MCHC: 32.6 g/dL (ref 30.0–36.0)
MCV: 89 fL (ref 78.0–100.0)
Monocytes Absolute: 0.6 10*3/uL (ref 0.1–1.0)
Monocytes Relative: 8 %
NEUTROS ABS: 4.2 10*3/uL (ref 1.7–7.7)
NEUTROS PCT: 55 %
PLATELETS: 329 10*3/uL (ref 150–400)
RBC: 4.62 MIL/uL (ref 3.87–5.11)
RDW: 14.1 % (ref 11.5–15.5)
WBC: 7.5 10*3/uL (ref 4.0–10.5)

## 2018-04-13 LAB — COMPREHENSIVE METABOLIC PANEL
ALBUMIN: 3.8 g/dL (ref 3.5–5.0)
ALK PHOS: 63 U/L (ref 38–126)
ALT: 17 U/L (ref 0–44)
AST: 19 U/L (ref 15–41)
Anion gap: 4 — ABNORMAL LOW (ref 5–15)
BUN: 6 mg/dL (ref 6–20)
CALCIUM: 8.7 mg/dL — AB (ref 8.9–10.3)
CHLORIDE: 105 mmol/L (ref 98–111)
CO2: 28 mmol/L (ref 22–32)
CREATININE: 0.71 mg/dL (ref 0.44–1.00)
GFR calc Af Amer: >60 mL/min (ref >60)
GFR calc non Af Amer: >60 mL/min (ref >60)
GLUCOSE: 100 mg/dL — AB (ref 70–99)
Potassium: 3.9 mmol/L (ref 3.5–5.1)
SODIUM: 137 mmol/L (ref 135–145)
Total Bilirubin: 0.6 mg/dL (ref 0.3–1.2)
Total Protein: 7.2 g/dL (ref 6.5–8.1)

## 2018-04-13 LAB — PROTIME-INR
INR: 1.01
Prothrombin Time: 13.2 seconds (ref 11.4–15.2)

## 2018-04-13 LAB — POCT PREGNANCY, URINE: Preg Test, Ur: NEGATIVE

## 2018-04-13 LAB — APTT: APTT: 29 s (ref 24–36)

## 2018-04-13 SURGERY — ANTERIOR CERVICAL DECOMPRESSION/DISCECTOMY FUSION 4 LEVELS
Anesthesia: General | Site: Neck

## 2018-04-13 MED ORDER — MENTHOL 3 MG MT LOZG: 1.0000 | LOZENGE | OROMUCOSAL | Status: DC | PRN

## 2018-04-13 MED ORDER — ACETAMINOPHEN 10 MG/ML IV SOLN
1000.0000 mg | Freq: Once | INTRAVENOUS | Status: AC
  Administered 2018-04-13: 1000 mg via INTRAVENOUS

## 2018-04-13 MED ORDER — ACETAMINOPHEN 10 MG/ML IV SOLN
INTRAVENOUS | Status: AC
  Filled 2018-04-13: qty 100

## 2018-04-13 MED ORDER — CEFAZOLIN SODIUM-DEXTROSE 2-4 GM/100ML-% IV SOLN
2.0000 g | INTRAVENOUS | Status: AC
  Administered 2018-04-13: 2 g via INTRAVENOUS
  Filled 2018-04-13: qty 100

## 2018-04-13 MED ORDER — DIAZEPAM 5 MG PO TABS
5.0000 mg | ORAL_TABLET | Freq: Four times a day (QID) | ORAL | Status: DC | PRN
  Administered 2018-04-13 – 2018-04-14 (×3): 5 mg via ORAL
  Filled 2018-04-13 (×3): qty 1

## 2018-04-13 MED ORDER — 0.9 % SODIUM CHLORIDE (POUR BTL) OPTIME
TOPICAL | Status: DC | PRN
  Administered 2018-04-13: 1000 mL

## 2018-04-13 MED ORDER — OXYCODONE-ACETAMINOPHEN 5-325 MG PO TABS
1.0000 | ORAL_TABLET | ORAL | Status: DC | PRN
  Administered 2018-04-13 – 2018-04-14 (×6): 2 via ORAL
  Filled 2018-04-13 (×6): qty 2

## 2018-04-13 MED ORDER — SODIUM CHLORIDE 0.9% FLUSH
3.0000 mL | Freq: Two times a day (BID) | INTRAVENOUS | Status: DC
  Administered 2018-04-13: 3 mL via INTRAVENOUS

## 2018-04-13 MED ORDER — FENTANYL CITRATE (PF) 100 MCG/2ML IJ SOLN
INTRAMUSCULAR | Status: AC
  Filled 2018-04-13: qty 2

## 2018-04-13 MED ORDER — FERROUS SULFATE 325 (65 FE) MG PO TABS: 325.0000 mg | ORAL_TABLET | ORAL | Status: DC

## 2018-04-13 MED ORDER — SUGAMMADEX SODIUM 200 MG/2ML IV SOLN
INTRAVENOUS | Status: DC | PRN
  Administered 2018-04-13: 170 mg via INTRAVENOUS

## 2018-04-13 MED ORDER — EPHEDRINE SULFATE 50 MG/ML IJ SOLN
INTRAMUSCULAR | Status: DC | PRN
  Administered 2018-04-13: 10 mg via INTRAVENOUS
  Administered 2018-04-13: 5 mg via INTRAVENOUS

## 2018-04-13 MED ORDER — ONDANSETRON HCL 4 MG PO TABS: 4.0000 mg | ORAL_TABLET | Freq: Four times a day (QID) | ORAL | Status: DC | PRN

## 2018-04-13 MED ORDER — ACETAMINOPHEN 325 MG PO TABS: 650.0000 mg | ORAL_TABLET | ORAL | Status: DC | PRN

## 2018-04-13 MED ORDER — DEXAMETHASONE SODIUM PHOSPHATE 10 MG/ML IJ SOLN
INTRAMUSCULAR | Status: AC
  Filled 2018-04-13: qty 1

## 2018-04-13 MED ORDER — PHENYLEPHRINE HCL 10 MG/ML IJ SOLN
INTRAMUSCULAR | Status: DC | PRN
  Administered 2018-04-13 (×3): 80 ug via INTRAVENOUS
  Administered 2018-04-13: 40 ug via INTRAVENOUS
  Administered 2018-04-13: 80 ug via INTRAVENOUS

## 2018-04-13 MED ORDER — SODIUM CHLORIDE 0.9 % IV SOLN
250.0000 mL | INTRAVENOUS | Status: DC
  Administered 2018-04-13: 250 mL via INTRAVENOUS

## 2018-04-13 MED ORDER — PROPOFOL 10 MG/ML IV BOLUS
INTRAVENOUS | Status: AC
  Filled 2018-04-13: qty 20

## 2018-04-13 MED ORDER — BUPIVACAINE-EPINEPHRINE (PF) 0.25% -1:200000 IJ SOLN
INTRAMUSCULAR | Status: AC
  Filled 2018-04-13: qty 30

## 2018-04-13 MED ORDER — ONDANSETRON HCL 4 MG/2ML IJ SOLN
INTRAMUSCULAR | Status: AC
  Filled 2018-04-13: qty 2

## 2018-04-13 MED ORDER — FENTANYL CITRATE (PF) 100 MCG/2ML IJ SOLN
INTRAMUSCULAR | Status: DC | PRN
  Administered 2018-04-13: 50 ug via INTRAVENOUS
  Administered 2018-04-13: 100 ug via INTRAVENOUS
  Administered 2018-04-13: 50 ug via INTRAVENOUS

## 2018-04-13 MED ORDER — ZOLPIDEM TARTRATE 5 MG PO TABS: 5.0000 mg | ORAL_TABLET | Freq: Every evening | ORAL | Status: DC | PRN

## 2018-04-13 MED ORDER — DEXAMETHASONE SODIUM PHOSPHATE 10 MG/ML IJ SOLN
INTRAMUSCULAR | Status: DC | PRN
  Administered 2018-04-13: 10 mg via INTRAVENOUS

## 2018-04-13 MED ORDER — FENTANYL CITRATE (PF) 100 MCG/2ML IJ SOLN
25.0000 ug | INTRAMUSCULAR | Status: DC | PRN
  Administered 2018-04-13 (×3): 50 ug via INTRAVENOUS

## 2018-04-13 MED ORDER — POVIDONE-IODINE 7.5 % EX SOLN
Freq: Once | CUTANEOUS | Status: DC
  Filled 2018-04-13: qty 118

## 2018-04-13 MED ORDER — MIDAZOLAM HCL 2 MG/2ML IJ SOLN
INTRAMUSCULAR | Status: DC | PRN
  Administered 2018-04-13: 2 mg via INTRAVENOUS

## 2018-04-13 MED ORDER — THROMBIN (RECOMBINANT) 20000 UNITS EX SOLR
CUTANEOUS | Status: AC
  Filled 2018-04-13: qty 20000

## 2018-04-13 MED ORDER — OXYCODONE HCL 5 MG PO TABS: 5.0000 mg | ORAL_TABLET | Freq: Once | ORAL | Status: DC | PRN

## 2018-04-13 MED ORDER — SODIUM CHLORIDE 0.9% FLUSH: 3.0000 mL | INTRAVENOUS | Status: DC | PRN

## 2018-04-13 MED ORDER — ADULT MULTIVITAMIN W/MINERALS CH
1.0000 | ORAL_TABLET | Freq: Every day | ORAL | Status: DC
  Administered 2018-04-13: 1 via ORAL
  Filled 2018-04-13: qty 1

## 2018-04-13 MED ORDER — LIDOCAINE 2% (20 MG/ML) 5 ML SYRINGE
INTRAMUSCULAR | Status: DC | PRN
  Administered 2018-04-13: 40 mg via INTRAVENOUS

## 2018-04-13 MED ORDER — OXYCODONE HCL 5 MG/5ML PO SOLN: 5.0000 mg | Freq: Once | ORAL | Status: DC | PRN

## 2018-04-13 MED ORDER — ROCURONIUM BROMIDE 10 MG/ML (PF) SYRINGE
PREFILLED_SYRINGE | INTRAVENOUS | Status: DC | PRN
  Administered 2018-04-13: 10 mg via INTRAVENOUS
  Administered 2018-04-13: 50 mg via INTRAVENOUS
  Administered 2018-04-13 (×2): 10 mg via INTRAVENOUS

## 2018-04-13 MED ORDER — GABAPENTIN 300 MG PO CAPS
600.0000 mg | ORAL_CAPSULE | Freq: Every day | ORAL | Status: DC
  Administered 2018-04-13: 600 mg via ORAL
  Filled 2018-04-13: qty 2

## 2018-04-13 MED ORDER — ONDANSETRON HCL 4 MG/2ML IJ SOLN: 4.0000 mg | Freq: Once | INTRAMUSCULAR | Status: DC | PRN

## 2018-04-13 MED ORDER — POTASSIUM CHLORIDE IN NACL 20-0.9 MEQ/L-% IV SOLN: INTRAVENOUS | Status: DC

## 2018-04-13 MED ORDER — ACETAMINOPHEN 650 MG RE SUPP: 650.0000 mg | RECTAL | Status: DC | PRN

## 2018-04-13 MED ORDER — PROPOFOL 10 MG/ML IV BOLUS
INTRAVENOUS | Status: DC | PRN
  Administered 2018-04-13: 170 mg via INTRAVENOUS

## 2018-04-13 MED ORDER — FENTANYL CITRATE (PF) 250 MCG/5ML IJ SOLN
INTRAMUSCULAR | Status: AC
  Filled 2018-04-13: qty 5

## 2018-04-13 MED ORDER — ONDANSETRON HCL 4 MG/2ML IJ SOLN: 4.0000 mg | Freq: Four times a day (QID) | INTRAMUSCULAR | Status: DC | PRN

## 2018-04-13 MED ORDER — CEFAZOLIN SODIUM-DEXTROSE 1-4 GM/50ML-% IV SOLN
1.0000 g | Freq: Three times a day (TID) | INTRAVENOUS | Status: AC
  Administered 2018-04-13 (×2): 1 g via INTRAVENOUS
  Filled 2018-04-13 (×2): qty 50

## 2018-04-13 MED ORDER — BUPIVACAINE-EPINEPHRINE 0.25% -1:200000 IJ SOLN
INTRAMUSCULAR | Status: DC | PRN
  Administered 2018-04-13: 10 mL

## 2018-04-13 MED ORDER — PHENYLEPHRINE 40 MCG/ML (10ML) SYRINGE FOR IV PUSH (FOR BLOOD PRESSURE SUPPORT)
PREFILLED_SYRINGE | INTRAVENOUS | Status: AC
  Filled 2018-04-13: qty 10

## 2018-04-13 MED ORDER — ALUM & MAG HYDROXIDE-SIMETH 200-200-20 MG/5ML PO SUSP: 30.0000 mL | Freq: Four times a day (QID) | ORAL | Status: DC | PRN

## 2018-04-13 MED ORDER — LACTATED RINGERS IV SOLN
INTRAVENOUS | Status: DC | PRN
  Administered 2018-04-13 (×2): via INTRAVENOUS

## 2018-04-13 MED ORDER — ONDANSETRON HCL 4 MG/2ML IJ SOLN
INTRAMUSCULAR | Status: DC | PRN
  Administered 2018-04-13: 4 mg via INTRAVENOUS

## 2018-04-13 MED ORDER — MIDAZOLAM HCL 2 MG/2ML IJ SOLN
INTRAMUSCULAR | Status: AC
  Filled 2018-04-13: qty 2

## 2018-04-13 MED ORDER — THROMBIN 20000 UNITS EX SOLR
CUTANEOUS | Status: DC | PRN
  Administered 2018-04-13: 20000 [IU] via TOPICAL

## 2018-04-13 MED ORDER — PHENOL 1.4 % MT LIQD
1.0000 | OROMUCOSAL | Status: DC | PRN
  Administered 2018-04-13: 1 via OROMUCOSAL
  Filled 2018-04-13: qty 177

## 2018-04-13 SURGICAL SUPPLY — 73 items
APL SKNCLS STERI-STRIP NONHPOA (GAUZE/BANDAGES/DRESSINGS) ×1
BENZOIN TINCTURE PRP APPL 2/3 (GAUZE/BANDAGES/DRESSINGS) ×2 IMPLANT
BIT DRILL NEURO 2X3.1 SFT TUCH (MISCELLANEOUS) ×1 IMPLANT
BIT DRILL SRG 14X2.2XFLT CHK (BIT) IMPLANT
BIT DRL SRG 14X2.2XFLT CHK (BIT) ×1
BLADE CLIPPER SURG (BLADE) ×2 IMPLANT
BLADE SURG 15 STRL LF DISP TIS (BLADE) ×1 IMPLANT
BLADE SURG 15 STRL SS (BLADE) ×2
BUR MATCHSTICK NEURO 3.0 LAGG (BURR) IMPLANT
CARTRIDGE OIL MAESTRO DRILL (MISCELLANEOUS) ×1 IMPLANT
COVER SURGICAL LIGHT HANDLE (MISCELLANEOUS) ×2 IMPLANT
CRADLE DONUT ADULT HEAD (MISCELLANEOUS) ×2 IMPLANT
DECANTER SPIKE VIAL GLASS SM (MISCELLANEOUS) ×2 IMPLANT
DIFFUSER DRILL AIR PNEUMATIC (MISCELLANEOUS) ×2 IMPLANT
DRAIN JACKSON RD 7FR 3/32 (WOUND CARE) IMPLANT
DRAPE C-ARM 42X72 X-RAY (DRAPES) ×2 IMPLANT
DRAPE POUCH INSTRU U-SHP 10X18 (DRAPES) ×2 IMPLANT
DRAPE SURG 17X23 STRL (DRAPES) ×6 IMPLANT
DRILL BIT SKYLINE 14MM (BIT) ×2
DRILL NEURO 2X3.1 SOFT TOUCH (MISCELLANEOUS) ×2
DURAPREP 26ML APPLICATOR (WOUND CARE) ×2 IMPLANT
ELECT COATED BLADE 2.86 ST (ELECTRODE) ×2 IMPLANT
ELECT REM PT RETURN 9FT ADLT (ELECTROSURGICAL) ×2
ELECTRODE REM PT RTRN 9FT ADLT (ELECTROSURGICAL) ×1 IMPLANT
EVACUATOR SILICONE 100CC (DRAIN) IMPLANT
GAUZE 4X4 16PLY RFD (DISPOSABLE) ×2 IMPLANT
GAUZE SPONGE 4X4 12PLY STRL (GAUZE/BANDAGES/DRESSINGS) ×2 IMPLANT
GLOVE BIO SURGEON STRL SZ7 (GLOVE) ×2 IMPLANT
GLOVE BIO SURGEON STRL SZ8 (GLOVE) ×5 IMPLANT
GLOVE BIOGEL PI IND STRL 7.0 (GLOVE) ×2 IMPLANT
GLOVE BIOGEL PI IND STRL 8 (GLOVE) ×1 IMPLANT
GLOVE BIOGEL PI INDICATOR 7.0 (GLOVE) ×2
GLOVE BIOGEL PI INDICATOR 8 (GLOVE) ×2
GOWN STRL REUS W/ TWL LRG LVL3 (GOWN DISPOSABLE) ×1 IMPLANT
GOWN STRL REUS W/ TWL XL LVL3 (GOWN DISPOSABLE) ×1 IMPLANT
GOWN STRL REUS W/TWL LRG LVL3 (GOWN DISPOSABLE) ×2
GOWN STRL REUS W/TWL XL LVL3 (GOWN DISPOSABLE) ×2
INTERLOCK LRDTC CRVCL VBR 7MM (Bone Implant) IMPLANT
IV CATH 14GX2 1/4 (CATHETERS) ×2 IMPLANT
KIT BASIN OR (CUSTOM PROCEDURE TRAY) ×2 IMPLANT
KIT TURNOVER KIT B (KITS) ×2 IMPLANT
LORDOTIC CERVICAL VBR 7MM SM (Bone Implant) ×6 IMPLANT
MANIFOLD NEPTUNE II (INSTRUMENTS) ×2 IMPLANT
NDL PRECISIONGLIDE 27X1.5 (NEEDLE) ×1 IMPLANT
NDL SPNL 20GX3.5 QUINCKE YW (NEEDLE) ×1 IMPLANT
NEEDLE PRECISIONGLIDE 27X1.5 (NEEDLE) ×2 IMPLANT
NEEDLE SPNL 20GX3.5 QUINCKE YW (NEEDLE) ×2 IMPLANT
NS IRRIG 1000ML POUR BTL (IV SOLUTION) ×2 IMPLANT
OIL CARTRIDGE MAESTRO DRILL (MISCELLANEOUS) ×2
PACK ORTHO CERVICAL (CUSTOM PROCEDURE TRAY) ×2 IMPLANT
PAD ARMBOARD 7.5X6 YLW CONV (MISCELLANEOUS) ×4 IMPLANT
PATTIES SURGICAL .5 X.5 (GAUZE/BANDAGES/DRESSINGS) IMPLANT
PATTIES SURGICAL .5 X1 (DISPOSABLE) IMPLANT
PIN DISTRACTION 14 (PIN) ×2 IMPLANT
PLATE SKYLINE THREE LEVEL 51MM (Plate) ×1 IMPLANT
PUTTY BONE DBX 5CC MIX (Putty) ×1 IMPLANT
SCREW SKYLINE VAR OS 14MM (Screw) ×8 IMPLANT
SPONGE INTESTINAL PEANUT (DISPOSABLE) ×4 IMPLANT
SPONGE SURGIFOAM ABS GEL 100 (HEMOSTASIS) ×2 IMPLANT
STRIP CLOSURE SKIN 1/2X4 (GAUZE/BANDAGES/DRESSINGS) ×2 IMPLANT
SURGIFLO W/THROMBIN 8M KIT (HEMOSTASIS) IMPLANT
SUT MNCRL AB 4-0 PS2 18 (SUTURE) ×2 IMPLANT
SUT VIC AB 2-0 CT2 18 VCP726D (SUTURE) ×2 IMPLANT
SYR BULB IRRIGATION 50ML (SYRINGE) ×2 IMPLANT
SYR CONTROL 10ML LL (SYRINGE) ×4 IMPLANT
TAPE CLOTH 4X10 WHT NS (GAUZE/BANDAGES/DRESSINGS) ×2 IMPLANT
TAPE CLOTH SURG 4X10 WHT LF (GAUZE/BANDAGES/DRESSINGS) ×1 IMPLANT
TAPE UMBILICAL COTTON 1/8X30 (MISCELLANEOUS) ×2 IMPLANT
TOWEL OR 17X24 6PK STRL BLUE (TOWEL DISPOSABLE) ×2 IMPLANT
TOWEL OR 17X26 10 PK STRL BLUE (TOWEL DISPOSABLE) ×2 IMPLANT
TRAY FOLEY MTR SLVR 16FR STAT (SET/KITS/TRAYS/PACK) ×1 IMPLANT
WATER STERILE IRR 1000ML POUR (IV SOLUTION) ×2 IMPLANT
YANKAUER SUCT BULB TIP NO VENT (SUCTIONS) ×2 IMPLANT

## 2018-04-13 NOTE — Op Note (Signed)
NAME:  Rachel Zamora                MEDICAL RECORD NO.:  [id number]  PHYSICIAN:  Estill Bamberg, MD      DATE OF BIRTH:  Nov 16, 1970  DATE OF PROCEDURE:  04/13/2018                              OPERATIVE REPORT   PREOPERATIVE DIAGNOSES: 1. Right-sided cervical radiculopathy. 2. Spinal stenosis spanning C4-C7. 3. Cervical kyphosis  POSTOPERATIVE DIAGNOSES: 1. Right-sided cervical radiculopathy. 2. Spinal stenosis spanning C4-C7. 3. Cervical kyphosis  PROCEDURE: 1. Anterior cervical decompression and fusion C4/5, C5/6, C6/7. 2. Placement of anterior instrumentation, C4-C7. 3. Insertion of interbody device x3 (Titan intervertebral spacers). 4. Intraoperative use of fluoroscopy. 5. Use of morselized allograft - DBX-mix.  SURGEON:  Estill Bamberg, MD  ASSISTANT:  Rachel Burn, PA-C  ANESTHESIA:  General endotracheal anesthesia.  COMPLICATIONS:  None.  DISPOSITION:  Stable.  ESTIMATED BLOOD LOSS:  Minimal.  INDICATIONS FOR SURGERY:  Briefly, Rachel Zamora is a pleasant 47 year old female, who did present to me with pain in the neck and right arm.  The patient's MRI did reveal the findings noted above.  Given the patient's ongoing rather debilitating pain and lack of improvement with appropriate treatment measures, we did discuss proceeding with the procedure noted above.  The patient was fully aware of the risks and limitations of surgery as outlined in my preoperative note.  OPERATIVE DETAILS:  On 04/13/2018, the patient was brought to surgery and general endotracheal anesthesia was administered.  The patient was placed supine on the hospital bed. The neck was gently extended.  All bony prominences were meticulously padded.  The neck was prepped and draped in the usual sterile fashion.  At this point, I did make a left-sided transverse incision.  The platysma was incised.  A Smith-Robinson approach was used and the anterior spine was identified. A  self-retaining retractor was placed.  I then subperiosteally exposed the vertebral bodies from C4-C7.  Caspar pins were then placed into the C6 and C7 vertebral bodies and distraction was applied.  A thorough and complete C6-7 intervertebral diskectomy was performed.  The posterior longitudinal ligament was identified and entered using a nerve hook.  I then used #1 followed by #2 Kerrison to perform a thorough and complete intervertebral diskectomy.  The spinal canal was thoroughly decompressed, as was the right and left neuroforamen.  The endplates were then prepared and the appropriate-sized intervertebral spacer was then packed with DBX-mix and tamped into position in the usual fashion.  The lower Caspar pin was then removed and placed into the C5 vertebral body and once again, distraction was applied across the C5-6 intervertebral space.  I then again performed a thorough and complete diskectomy, thoroughly decompressing the spinal canal and bilateral neuroforamena.  After preparing the endplates, the appropriate-sized intervertebral spacer was packed with DBX-mix and tamped into position.  The lower Caspar pin was then removed and placed into the C4 vertebral body and once again, distraction was applied across the C4-5 intervertebral space.  I then again performed a thorough and complete diskectomy, thoroughly decompressing the spinal canal and bilateral neuroforamena.  After preparing the endplates, the appropriate-sized intervertebral spacer was packed with DBX-mix and tamped into position.  The Caspar pins then were removed and bone wax was placed in their place.  The appropriate-sized anterior cervical plate was placed over the anterior spine.  14 mm variable angle screws were placed, 2 in each vertebral body from C4-C7 for a total of 8 vertebral body screws.  The screws were then locked to the plate using the Cam locking mechanism.  I was very pleased with the final fluoroscopic  images.  The wound was then irrigated.  The wound was then explored for any undue bleeding and there was no bleeding noted. The wound was then closed in layers using 2-0 Vicryl, followed by 4-0 Monocryl.  Benzoin and Steri-Strips were applied, followed by sterile dressing.  All instrument counts were correct at the termination of the procedure.  Of note, Rachel BurnEric Phillips, PA-C, was my assistant for surgery, and did aid in retraction, suctioning, and closure.     Estill BambergMark Darus Hershman, MD

## 2018-04-13 NOTE — Transfer of Care (Signed)
Immediate Anesthesia Transfer of Care Note  Patient: Rachel ClimesLenora M Bess  Procedure(s) Performed: ANTERIOR CERVICAL DECOMPRESSION FUSION, CERVICAL 4-5, CERVICAL 5-6, CERVICAL 6-7 WITH INSTRUMENTATION AND ALLOGRAFT (N/A Neck)  Patient Location: PACU  Anesthesia Type:General  Level of Consciousness: drowsy  Airway & Oxygen Therapy: Patient Spontanous Breathing and Patient connected to nasal cannula oxygen  Post-op Assessment: Report given to RN, Post -op Vital signs reviewed and stable and Patient moving all extremities  Post vital signs: Reviewed and stable  Last Vitals:  Vitals Value Taken Time  BP 124/71 04/13/2018 10:42 AM  Temp    Pulse 74 04/13/2018 10:43 AM  Resp 17 04/13/2018 10:43 AM  SpO2 100 % 04/13/2018 10:43 AM  Vitals shown include unvalidated device data.  Last Pain:  Vitals:   04/13/18 0636  TempSrc: Oral  PainSc: 4       Patients Stated Pain Goal: 3 (04/13/18 0636)  Complications: No apparent anesthesia complications

## 2018-04-13 NOTE — Anesthesia Procedure Notes (Addendum)
Procedure Name: Intubation Date/Time: 04/13/2018 7:43 AM Performed by: Leonor Liv, CRNA Pre-anesthesia Checklist: Patient identified, Emergency Drugs available, Suction available and Patient being monitored Patient Re-evaluated:Patient Re-evaluated prior to induction Oxygen Delivery Method: Circle System Utilized Preoxygenation: Pre-oxygenation with 100% oxygen Induction Type: IV induction Ventilation: Mask ventilation without difficulty Laryngoscope Size: Mac and 3 Grade View: Grade I Tube type: Oral Tube size: 7.0 mm Number of attempts: 1 Airway Equipment and Method: Stylet and Oral airway Placement Confirmation: ETT inserted through vocal cords under direct vision,  positive ETCO2 and breath sounds checked- equal and bilateral Secured at: 21 cm Tube secured with: Tape Dental Injury: Teeth and Oropharynx as per pre-operative assessment

## 2018-04-13 NOTE — Anesthesia Postprocedure Evaluation (Signed)
Anesthesia Post Note  Patient: Rachel Zamora  Procedure(s) Performed: ANTERIOR CERVICAL DECOMPRESSION FUSION, CERVICAL 4-5, CERVICAL 5-6, CERVICAL 6-7 WITH INSTRUMENTATION AND ALLOGRAFT (N/A Neck)     Patient location during evaluation: PACU Anesthesia Type: General Level of consciousness: awake and alert Pain management: pain level controlled Vital Signs Assessment: post-procedure vital signs reviewed and stable Respiratory status: spontaneous breathing, nonlabored ventilation, respiratory function stable and patient connected to nasal cannula oxygen Cardiovascular status: blood pressure returned to baseline and stable Postop Assessment: no apparent nausea or vomiting Anesthetic complications: no    Last Vitals:  Vitals:   04/13/18 1210 04/13/18 1244  BP: 133/84 140/83  Pulse: (!) 58 61  Resp: 12 17  Temp:    SpO2: 97% 100%    Last Pain:  Vitals:   04/13/18 1210  TempSrc:   PainSc: Asleep                 Damareon Lanni COKER

## 2018-04-13 NOTE — Progress Notes (Signed)
Orthopedic Tech Progress Note Patient Details:  Rachel Zamora 17-May-1971 [id number]  Ortho Devices Type of Ortho Device: [place] cervical collar Ortho Device/Splint Interventions: Freeman CaldronOrdered       Luddie Boghosian 04/13/2018, 1:00 PM

## 2018-04-13 NOTE — Progress Notes (Signed)
Orthopedic Tech Progress Note Patient Details:  Rachel Zamora March 04, 1971 [id number]  Patient ID: Rachel Zamora, female   DOB: March 04, 1971, 47 y.o.   MRN: [id number]   Nikki DomCrawford, Isela Stantz 04/13/2018, 4:03 PM Ortho visit and ortho charge for [place] collar to be deleted because RN says that pt has her own collar

## 2018-04-13 NOTE — Anesthesia Preprocedure Evaluation (Signed)
Anesthesia Evaluation  Patient identified by MRN, date of birth, ID band Patient awake    Reviewed: Allergy & Precautions, NPO status , Patient's Chart, lab work & pertinent test results  Airway Mallampati: II  TM Distance: >3 FB Neck ROM: Limited    Dental  (+) Teeth Intact, Dental Advisory Given   Pulmonary    breath sounds clear to auscultation       Cardiovascular  Rhythm:Regular Rate:Normal     Neuro/Psych    GI/Hepatic   Endo/Other  diabetes  Renal/GU      Musculoskeletal   Abdominal   Peds  Hematology   Anesthesia Other Findings   Reproductive/Obstetrics                             Anesthesia Physical Anesthesia Plan  ASA: II  Anesthesia Plan: General   Post-op Pain Management:    Induction: Intravenous  PONV Risk Score and Plan: 1 and Ondansetron and Dexamethasone  Airway Management Planned: Oral ETT  Additional Equipment:   Intra-op Plan:   Post-operative Plan: Extubation in OR  Informed Consent: I have reviewed the patients History and Physical, chart, labs and discussed the procedure including the risks, benefits and alternatives for the proposed anesthesia with the patient or authorized representative who has indicated his/her understanding and acceptance.   Dental advisory given  Plan Discussed with: CRNA and Anesthesiologist  Anesthesia Plan Comments:         Anesthesia Quick Evaluation

## 2018-04-13 NOTE — H&P (Signed)
PREOPERATIVE H&P  Chief Complaint: Right arm pain  HPI: Rachel Zamora is a 47 y.o. female who presents with ongoing pain in the right arm  MRI reveals stenosis spaning C4-C7  Patient has failed multiple forms of conservative care and continues to have pain (see office notes for additional details regarding the patient's full course of treatment)  Past Medical History:  Diagnosis Date  . Abnormal Pap smear   . AMA (advanced maternal age) multigravida 35+   . Anemia   . Cervical radiculopathy    right- sided  . Gestational diabetes    diet controlled  . [place] multiparity in labor and delivery, antepartum   . Headache   . History of hiatal hernia   . HPV (human papilloma virus) anogenital infection    Past Surgical History:  Procedure Laterality Date  . CHOLECYSTECTOMY    . DILATION AND CURETTAGE OF UTERUS    . GYNECOLOGIC CRYOSURGERY    . HERNIA REPAIR    . TAB     Social History   Socioeconomic History  . Marital status: Married    Spouse name: Not on file  . Number of children: Not on file  . Years of education: Not on file  . Highest education level: Not on file  Occupational History  . Not on file  Social Needs  . Financial resource strain: Not on file  . Food insecurity:    Worry: Not on file    Inability: Not on file  . Transportation needs:    Medical: Not on file    Non-medical: Not on file  Tobacco Use  . Smoking status: Never Smoker  . Smokeless tobacco: Never Used  Substance and Sexual Activity  . Alcohol use: No  . Drug use: No  . Sexual activity: Yes  Lifestyle  . Physical activity:    Days per week: Not on file    Minutes per session: Not on file  . Stress: Not on file  Relationships  . Social connections:    Talks on phone: Not on file    Gets together: Not on file    Attends religious service: Not on file    Active member of club or organization: Not on file    Attends meetings of clubs or organizations: Not on file   Relationship status: Not on file  Other Topics Concern  . Not on file  Social History Narrative  . Not on file   Family History  Problem Relation Age of Onset  . Diabetes Father   . Breast cancer Neg Hx    No Known Allergies Prior to Admission medications   Medication Sig Start Date End Date Taking? Authorizing Provider  ferrous sulfate 325 (65 FE) MG tablet Take 325 mg by mouth once a week.   Yes [provider]  gabapentin (NEURONTIN) 300 MG capsule Take 600 mg by mouth at bedtime.  01/31/18  Yes [provider]  meloxicam (MOBIC) 15 MG tablet Take 15 mg by mouth daily as needed for pain.   Yes [provider]  Multiple Vitamin (MULTIVITAMIN WITH MINERALS) TABS tablet Take 1 tablet by mouth at bedtime.   Yes [provider]  traMADol (ULTRAM) 50 MG tablet Take 50 mg by mouth at bedtime.   Yes [provider]     All other systems have been reviewed and were otherwise negative with the exception of those mentioned in the HPI and as above.  Physical Exam: There were  no vitals filed for this visit.  There is no height or weight on file to calculate BMI.  General: Alert, no acute distress Cardiovascular: No pedal edema Respiratory: No cyanosis, no use of accessory musculature Skin: No lesions in the area of chief complaint Neurologic: Sensation intact distally Psychiatric: Patient is competent for consent with normal mood and affect Lymphatic: No axillary or cervical lymphadenopathy  MUSCULOSKELETAL: + spurling on the right  Assessment/Plan: RIGHT-SIDED CERVICAL RADICULOPATHY Plan for Procedure(s): ANTERIOR CERVICAL DECOMPRESSION FUSION, CERVICAL 4-5, CERVICAL 5-6, CERVICAL 6-7 WITH INSTRUMENTATION AND ALLOGRAFT   Emilee HeroUMONSKI,Kaidon Kinker LEONARD, MD 04/13/2018 6:28 AM

## 2018-04-14 DIAGNOSIS — M40202 Unspecified kyphosis, cervical region: Secondary | ICD-10-CM | POA: Diagnosis not present

## 2018-04-14 NOTE — Progress Notes (Signed)
    Patient doing well  Denies arm pain Tolerating PO well   Physical Exam: Vitals:   04/14/18 0331 04/14/18 0333  BP: (!) 95/58 105/65  Pulse: 73 72  Resp: 18   Temp: 98.6 F (37 C)   SpO2: 100%     Neck soft/supple Dressing in place NVI  POD #1 s/p C4-7 ACDF, doing well  - encourage ambulation - Percocet for pain, Valium for muscle spasms - d/c home today with f/u in 2 weeks

## 2018-04-14 NOTE — Progress Notes (Signed)
Patient alert and oriented, mae's well, voiding adequate amount of urine, swallowing without difficulty, no c/o pain at time of discharge. Patient discharged home with family. Script and discharged instructions given to patient. Patient and family stated understanding of instructions given. Patient has an appointment with Dr. Dumonski 

## 2018-04-16 LAB — BPAM RBC
BLOOD PRODUCT EXPIRATION DATE: 201910072359
Blood Product Expiration Date: 201910062359
ISSUE DATE / TIME: 201909171433
ISSUE DATE / TIME: 201909171433
UNIT TYPE AND RH: 6200
Unit Type and Rh: 6200

## 2018-04-16 LAB — TYPE AND SCREEN
ABO/RH(D): A POS
ANTIBODY SCREEN: NEGATIVE
UNIT DIVISION: 0
Unit division: 0

## 2018-04-17 ENCOUNTER — Encounter (HOSPITAL_COMMUNITY): Payer: Self-pay | Admitting: Orthopedic Surgery

## 2018-12-25 ENCOUNTER — Ambulatory Visit (INDEPENDENT_AMBULATORY_CARE_PROVIDER_SITE_OTHER): Payer: 59 | Admitting: Internal Medicine

## 2018-12-25 ENCOUNTER — Encounter: Payer: Self-pay | Admitting: Internal Medicine

## 2018-12-25 ENCOUNTER — Other Ambulatory Visit: Payer: Self-pay

## 2018-12-25 VITALS — BP 126/82 | HR 88 | Temp 98.2°F | Ht 66.8 in | Wt 183.6 lb

## 2018-12-25 DIAGNOSIS — Z0000 Encounter for general adult medical examination without abnormal findings: Secondary | ICD-10-CM

## 2018-12-25 DIAGNOSIS — M62838 Other muscle spasm: Secondary | ICD-10-CM

## 2018-12-25 DIAGNOSIS — M5412 Radiculopathy, cervical region: Secondary | ICD-10-CM

## 2018-12-25 DIAGNOSIS — Z23 Encounter for immunization: Secondary | ICD-10-CM

## 2018-12-25 LAB — POCT URINALYSIS DIPSTICK
Bilirubin, UA: NEGATIVE
Glucose, UA: NEGATIVE
Ketones, UA: NEGATIVE
Leukocytes, UA: NEGATIVE
Nitrite, UA: NEGATIVE
Protein, UA: NEGATIVE
Spec Grav, UA: 1.025 (ref 1.010–1.025)
Urobilinogen, UA: 0.2 E.U./dL
pH, UA: 5.5 (ref 5.0–8.0)

## 2018-12-25 MED ORDER — GABAPENTIN 300 MG PO CAPS: 300.0000 mg | ORAL_CAPSULE | Freq: Every day | ORAL | 1 refills | Status: DC

## 2018-12-25 MED ORDER — TETANUS-DIPHTH-ACELL PERTUSSIS 5-2.5-18.5 LF-MCG/0.5 IM SUSP
0.5000 mL | Freq: Once | INTRAMUSCULAR | Status: AC
  Administered 2018-12-25: 0.5 mL via INTRAMUSCULAR

## 2018-12-25 MED ORDER — MAGNESIUM 400 MG PO CAPS: 400.0000 mg | ORAL_CAPSULE | Freq: Every day | ORAL | 1 refills | Status: DC

## 2018-12-25 NOTE — Addendum Note (Signed)
Addended by: Mariam Dollar on: 12/25/2018 04:48 PM   Modules accepted: Orders, SmartSet

## 2018-12-25 NOTE — Progress Notes (Signed)
Subjective:     Patient ID: Rachel Zamora , female    DOB: 27-May-1971 , 48 y.o.   MRN: [id number]   Chief Complaint  Patient presents with  . Annual Exam    HPI  She is here today for a full physical examination. She is followed by Dr. Paula Compton for her Gyn exams. She is now overdue, her appt was rescheduled due to the COVID-19 pandemic. She is due to be seen by next month.     Past Medical History:  Diagnosis Date  . Abnormal Pap smear   . AMA (advanced maternal age) multigravida 69+   . Anemia   . Cervical radiculopathy    right- sided  . Gestational diabetes    diet controlled  . [place] multiparity in labor and delivery, antepartum   . Headache   . History of hiatal hernia   . HPV (human papilloma virus) anogenital infection      Family History  Problem Relation Age of Onset  . Diabetes Father   . Hypertension Father   . Hypertension Mother   . Diabetes Mother   . Breast cancer Neg Hx      Current Outpatient Medications:  .  cyclobenzaprine (FLEXERIL) 5 MG tablet, TAKE 1 2 TABLETS BY MOUTH AT BEDTIME AS NEEDED, Disp: , Rfl:  .  ferrous sulfate 325 (65 FE) MG tablet, Take 325 mg by mouth once a week., Disp: , Rfl:  .  gabapentin (NEURONTIN) 300 MG capsule, Take 600 mg by mouth at bedtime. , Disp: , Rfl: 1 .  methocarbamol (ROBAXIN) 500 MG tablet, , Disp: , Rfl:  .  Multiple Vitamin (MULTIVITAMIN WITH MINERALS) TABS tablet, Take 1 tablet by mouth at bedtime., Disp: , Rfl:    No Known Allergies    The patient states she uses none for birth control. Last LMP was Patient's last menstrual period was 12/05/2018.. Negative for Dysmenorrhea  Negative for: breast discharge, breast lump(s), breast pain and breast self exam. Associated symptoms include abnormal vaginal bleeding. Pertinent negatives include abnormal bleeding (hematology), anxiety, decreased libido, depression, difficulty falling sleep, dyspareunia, history of infertility, nocturia, sexual dysfunction,  sleep disturbances, urinary incontinence, urinary urgency, vaginal discharge and vaginal itching. Diet regular.The patient states her exercise level is  minimal.   . The patient's tobacco use is:  Social History   Tobacco Use  Smoking Status Never Smoker  Smokeless Tobacco Never Used  . She has been exposed to passive smoke. The patient's alcohol use is:  Social History   Substance and Sexual Activity  Alcohol Use No  . Additional information: Last pap Dec 2018, next one scheduled for 2020.    Review of Systems  Constitutional: Negative.   HENT: Negative.   Eyes: Negative.   Respiratory: Negative.   Cardiovascular: Negative.   Gastrointestinal: Negative.   Endocrine: Negative.   Genitourinary: Negative.   Musculoskeletal: Positive for myalgias.       She c/o muscle spasm in her neck. No relief with muscle relaxers.  Skin: Negative.   Allergic/Immunologic: Negative.   Neurological: Positive for numbness.       Has tingling in r fingertips. Had cervical spine surgery in Sept 2019. Needs refill of gabapentin.   Hematological: Negative.   Psychiatric/Behavioral: Negative.      Today's Vitals   12/25/18 1423  BP: 126/82  Pulse: 88  Temp: 98.2 F (36.8 C)  Weight: 183 lb 9.6 oz (83.3 kg)  Height: 5' 6.8" (1.697 m)   Body mass  index is 28.93 kg/m.   Objective:  Physical Exam Vitals signs and nursing note reviewed.  Constitutional:      Appearance: Normal appearance.  HENT:     Head: Normocephalic and atraumatic.     Right Ear: Tympanic membrane, ear canal and external ear normal.     Left Ear: Tympanic membrane, ear canal and external ear normal.     Nose: Nose normal.     Mouth/Throat:     Mouth: Mucous membranes are moist.     Pharynx: Oropharynx is clear.  Eyes:     Extraocular Movements: Extraocular movements intact.     Conjunctiva/sclera: Conjunctivae normal.     Pupils: Pupils are equal, round, and reactive to light.  Neck:     Musculoskeletal: Normal  range of motion and neck supple. Muscular tenderness present.     Comments: Trapezius mm. Tender to palpation on the right Cardiovascular:     Rate and Rhythm: Normal rate and regular rhythm.     Pulses: Normal pulses.     Heart sounds: Normal heart sounds.  Pulmonary:     Effort: Pulmonary effort is normal.     Breath sounds: Normal breath sounds.  Chest:     Breasts:        Right: Normal. No swelling, bleeding, inverted nipple, mass or nipple discharge.        Left: Normal. No swelling, bleeding, inverted nipple, mass or nipple discharge.  Abdominal:     General: Abdomen is flat. Bowel sounds are normal.     Palpations: Abdomen is soft.  Genitourinary:    Comments: deferred Musculoskeletal: Normal range of motion.  Skin:    General: Skin is warm and dry.  Neurological:     General: No focal deficit present.     Mental Status: She is alert and oriented to person, place, and time.  Psychiatric:        Mood and Affect: Mood normal.        Behavior: Behavior normal.         Assessment And Plan:     1. Routine general medical examination at health care facility  A full exam was performed. Importance of monthly self breast exams was discussed with the patient. PATIENT HAS BEEN ADVISED TO GET 30-45 MINUTES REGULAR EXERCISE NO LESS THAN FOUR TO FIVE DAYS PER WEEK - BOTH WEIGHTBEARING EXERCISES AND AEROBIC ARE RECOMMENDED.  SHE IS ADVISED TO FOLLOW A HEALTHY DIET WITH AT LEAST SIX FRUITS/VEGGIES PER DAY, DECREASE INTAKE OF RED MEAT, AND TO INCREASE FISH INTAKE TO TWO DAYS PER WEEK.  MEATS/FISH SHOULD NOT BE FRIED, BAKED OR BROILED IS PREFERABLE.  I SUGGEST WEARING SPF 50 SUNSCREEN ON EXPOSED PARTS AND ESPECIALLY WHEN IN THE DIRECT SUNLIGHT FOR AN EXTENDED PERIOD OF TIME.  PLEASE AVOID FAST FOOD RESTAURANTS AND INCREASE YOUR WATER INTAKE.  - CMP14+EGFR - CBC - Lipid panel - Hemoglobin A1c  2. Cervical radiculopathy  Chronic. She was given refill of gabapentin 369m nightly.   3.  Muscle spasm  She was given rx magnesium, 4026mnightly. She is also encouraged to apply Vicks Vaporub to neck/bilateral shoulders nightly as needed.   4. Need for vaccination  - Tdap (BOOSTRIX) injection 0.5 mL   RoMaximino GreenlandMD    THE PATIENT IS ENCOURAGED TO PRACTICE SOCIAL DISTANCING DUE TO THE COVID-19 PANDEMIC.

## 2018-12-25 NOTE — Addendum Note (Signed)
Addended by: Gwynneth Aliment on: 12/25/2018 04:53 PM   Modules accepted: Orders

## 2018-12-25 NOTE — Patient Instructions (Signed)

## 2018-12-26 LAB — CMP14+EGFR
ALT: 24 IU/L (ref 0–32)
AST: 22 IU/L (ref 0–40)
Albumin/Globulin Ratio: 1.5 (ref 1.2–2.2)
Albumin: 4.6 g/dL (ref 3.8–4.8)
Alkaline Phosphatase: 72 IU/L (ref 39–117)
BUN/Creatinine Ratio: 13 (ref 9–23)
BUN: 11 mg/dL (ref 6–24)
Bilirubin Total: 0.3 mg/dL (ref 0.0–1.2)
CO2: 24 mmol/L (ref 20–29)
Calcium: 9.7 mg/dL (ref 8.7–10.2)
Chloride: 100 mmol/L (ref 96–106)
Creatinine, Ser: 0.84 mg/dL (ref 0.57–1.00)
GFR calc Af Amer: 95 mL/min/{1.73_m2} (ref >59)
GFR calc non Af Amer: 82 mL/min/{1.73_m2} (ref >59)
Globulin, Total: 3 g/dL (ref 1.5–4.5)
Glucose: 85 mg/dL (ref 65–99)
Potassium: 4.2 mmol/L (ref 3.5–5.2)
Sodium: 138 mmol/L (ref 134–144)
Total Protein: 7.6 g/dL (ref 6.0–8.5)

## 2018-12-26 LAB — LIPID PANEL
Chol/HDL Ratio: 2.8 ratio (ref 0.0–4.4)
Cholesterol, Total: 130 mg/dL (ref 100–199)
HDL: 47 mg/dL (ref >39)
LDL Calculated: 66 mg/dL (ref 0–99)
Triglycerides: 86 mg/dL (ref 0–149)
VLDL Cholesterol Cal: 17 mg/dL (ref 5–40)

## 2018-12-26 LAB — HEMOGLOBIN A1C
Est. average glucose Bld gHb Est-mCnc: 117 mg/dL
Hgb A1c MFr Bld: 5.7 % — ABNORMAL HIGH (ref 4.8–5.6)

## 2018-12-26 LAB — CBC
Hematocrit: 42.5 % (ref 34.0–46.6)
Hemoglobin: 14 g/dL (ref 11.1–15.9)
MCH: 30.2 pg (ref 26.6–33.0)
MCHC: 32.9 g/dL (ref 31.5–35.7)
MCV: 92 fL (ref 79–97)
Platelets: 366 10*3/uL (ref 150–450)
RBC: 4.63 x10E6/uL (ref 3.77–5.28)
RDW: 12.8 % (ref 11.7–15.4)
WBC: 8.9 10*3/uL (ref 3.4–10.8)

## 2018-12-27 ENCOUNTER — Telehealth: Payer: Self-pay

## 2018-12-27 NOTE — Telephone Encounter (Signed)
Left the patient a message to call back for lab results. 

## 2018-12-27 NOTE — Telephone Encounter (Signed)
-----   Message from Dorothyann Peng, MD sent at 12/26/2018 12:30 PM EDT ----- Blood count, liver and kidney fxn are nl.  Chol is great. However, we want to be sure that we keep the good chol, HDL above 50. Exercising no less than 150 minutes per week will help you achieve this goal. Your hba1c is 5.7, this is in predm range. Avoid sugary beverages and increase exercise - this will help improve this status

## 2019-06-28 ENCOUNTER — Ambulatory Visit (INDEPENDENT_AMBULATORY_CARE_PROVIDER_SITE_OTHER): Payer: 59 | Admitting: Internal Medicine

## 2019-06-28 ENCOUNTER — Other Ambulatory Visit: Payer: Self-pay

## 2019-06-28 ENCOUNTER — Encounter: Payer: Self-pay | Admitting: Internal Medicine

## 2019-06-28 VITALS — BP 116/82 | HR 66 | Temp 98.8°F | Ht 66.8 in | Wt 180.0 lb

## 2019-06-28 DIAGNOSIS — M5412 Radiculopathy, cervical region: Secondary | ICD-10-CM

## 2019-06-28 DIAGNOSIS — M62838 Other muscle spasm: Secondary | ICD-10-CM

## 2019-06-28 DIAGNOSIS — Z23 Encounter for immunization: Secondary | ICD-10-CM

## 2019-06-28 NOTE — Patient Instructions (Signed)
Calm - powdered magnesium, one tsp nightly   Cervical Radiculopathy  Cervical radiculopathy happens when a nerve in the neck (a cervical nerve) is pinched or bruised. This condition can happen because of an injury to the cervical spine (vertebrae) in the neck, or as part of the normal aging process. Pressure on the cervical nerves can cause pain or numbness that travels from the neck all the way down into the arm and fingers. Usually, this condition gets better with rest. Treatment may be needed if the condition does not improve. What are the causes? This condition may be caused by:  A neck injury.  A bulging (herniated) disk.  Muscle spasms.  Muscle tightness in the neck because of overuse.  Arthritis.  Breakdown or degeneration in the bones and joints of the spine (spondylosis) due to aging.  Bone spurs that may develop near the cervical nerves. What are the signs or symptoms? Symptoms of this condition include:  Pain. The pain may travel from the neck to the arm and hand. The pain can be severe or irritating. It may be worse when you move your neck.  Numbness or tingling in your arm or hand.  Weakness in the affected arm and hand, in severe cases. How is this diagnosed? This condition may be diagnosed based on your symptoms, your medical history, and a physical exam. You may also have tests, including:  X-rays.  A CT scan.  An MRI.  An electromyogram (EMG).  Nerve conduction tests. How is this treated? In many cases, treatment is not needed for this condition. With rest, the condition usually gets better over time. If treatment is needed, options may include:  Wearing a soft neck collar (cervical collar) for short periods of time, as told by your health care provider.  Doing physical therapy to strengthen your neck muscles.  Taking medicines, such as NSAIDs or oral corticosteroids.  Having spinal injections, in severe cases.  Having surgery. This may be needed  if other treatments do not help. Different types of surgery may be done depending on the cause of this condition. Follow these instructions at home: If you have a cervical collar:  Wear it as told by your health care provider. Remove it only as told by your health care provider.  Ask your health care provider if you can remove the collar for cleaning and bathing. If you are allowed to remove the collar for cleaning or bathing: ? Follow instructions from your health care provider about how to remove the collar safely. ? Clean the collar by wiping it with mild soap and water and drying it completely. ? Take out any removable pads in the collar every 1-2 days, and wash them by hand with soap and water. Let them air-dry completely before you put them back in the collar. ? Check your skin under the collar for irritation or sores. If you see any, tell your health care provider. Managing pain      Take over-the-counter and prescription medicines only as told by your health care provider.  If directed, put ice on the affected area. ? If you have a soft neck collar, remove it as told by your health care provider. ? Put ice in a plastic bag. ? Place a towel between your skin and the bag. ? Leave the ice on for 20 minutes, 2-3 times a day.  If applying ice does not help, you can try using heat. Use the heat source that your health care provider recommends, such  as a moist heat pack or a heating pad. ? Place a towel between your skin and the heat source. ? Leave the heat on for 20-30 minutes. ? Remove the heat if your skin turns bright red. This is especially important if you are unable to feel pain, heat, or cold. You may have a greater risk of getting burned.  Try a gentle neck and shoulder massage to help relieve symptoms. Activity  Rest as needed.  Return to your normal activities as told by your health care provider. Ask your health care provider what activities are safe for you.  Do  stretching and strengthening exercises as told by your health care provider or physical therapist.  Do not lift anything that is heavier than 10 lb (4.5 kg) until your health care provider tells you that it is safe. General instructions  Use a flat pillow when you sleep.  Do not drive while wearing a cervical collar. If you do not have a cervical collar, ask your health care provider if it is safe to drive while your neck heals.  Ask your health care provider if the medicine prescribed to you requires you to avoid driving or using heavy machinery.  Do not use any products that contain nicotine or tobacco, such as cigarettes, e-cigarettes, and chewing tobacco. These can delay healing. If you need help quitting, ask your health care provider.  Keep all follow-up visits as told by your health care provider. This is important. Contact a health care provider if:  Your condition does not improve with treatment. Get help right away if:  Your pain gets much worse and cannot be controlled with medicines.  You have weakness or numbness in your hand, arm, face, or leg.  You have a high fever.  You have a stiff, rigid neck.  You lose control of your bowels or your bladder (have incontinence).  You have trouble with walking, balance, or speaking. Summary  Cervical radiculopathy happens when a nerve in the neck is pinched or bruised.  A nerve can get pinched from a bulging disk, arthritis, muscle spasms, or an injury to the neck.  Symptoms include pain, tingling, or numbness radiating from the neck into the arm or hand. Weakness can also occur in severe cases.  Treatment may include rest, wearing a cervical collar, and physical therapy. Medicines may be prescribed to help with pain. In severe cases, injections or surgery may be needed. This information is not intended to replace advice given to you by your health care provider. Make sure you discuss any questions you have with your health  care provider. Document Released: 04/06/2001 Document Revised: 06/02/2018 Document Reviewed: 06/02/2018 Elsevier Patient Education  2020 [employer].

## 2019-07-02 ENCOUNTER — Encounter: Payer: Self-pay | Admitting: Internal Medicine

## 2019-07-02 MED ORDER — TRAMADOL HCL 50 MG PO TABS: 50.0000 mg | ORAL_TABLET | Freq: Four times a day (QID) | ORAL | 0 refills | Status: DC | PRN

## 2019-07-02 NOTE — Progress Notes (Signed)
This visit occurred during the SARS-CoV-2 public health emergency.  Safety protocols were in place, including screening questions prior to the visit, additional usage of staff PPE, and extensive cleaning of exam room while observing appropriate contact time as indicated for disinfecting solutions.  Subjective:     Patient ID: Rachel Zamora , female    DOB: 1971-01-02 , 48 y.o.   MRN: [id number]   Chief Complaint  Patient presents with  . Gabpentin f/u    HPI  She is here today for f/u chronic neck pain/muscle spasms. She has had some relief with gabapentin. She takes nightly. Reports needing something stronger for flares.     Past Medical History:  Diagnosis Date  . Abnormal Pap smear   . AMA (advanced maternal age) multigravida 35+   . Anemia   . Cervical radiculopathy    right- sided  . Gestational diabetes    diet controlled  . [place] multiparity in labor and delivery, antepartum   . Headache   . History of hiatal hernia   . HPV (human papilloma virus) anogenital infection      Family History  Problem Relation Age of Onset  . Diabetes Father   . Hypertension Father   . Hypertension Mother   . Diabetes Mother   . Breast cancer Neg Hx      Current Outpatient Medications:  .  cyclobenzaprine (FLEXERIL) 5 MG tablet, TAKE 1 2 TABLETS BY MOUTH AT BEDTIME AS NEEDED, Disp: , Rfl:  .  ferrous sulfate 325 (65 FE) MG tablet, Take 325 mg by mouth once a week., Disp: , Rfl:  .  gabapentin (NEURONTIN) 300 MG capsule, Take 1 capsule (300 mg total) by mouth at bedtime., Disp: 90 capsule, Rfl: 1 .  methocarbamol (ROBAXIN) 500 MG tablet, , Disp: , Rfl:  .  Multiple Vitamin (MULTIVITAMIN WITH MINERALS) TABS tablet, Take 1 tablet by mouth at bedtime., Disp: , Rfl:  .  Magnesium 400 MG CAPS, Take 400 mg by mouth at bedtime. (Patient not taking: Reported on 06/28/2019), Disp: 90 capsule, Rfl: 1 .  traMADol (ULTRAM) 50 MG tablet, Take 1 tablet (50 mg total) by mouth every 6 (six) hours  as needed., Disp: 20 tablet, Rfl: 0   No Known Allergies   Review of Systems  Constitutional: Negative.   Respiratory: Negative.   Cardiovascular: Negative.   Gastrointestinal: Negative.   Musculoskeletal: Positive for neck pain.  Neurological: Negative.   Psychiatric/Behavioral: Negative.      Today's Vitals   06/28/19 1512  BP: 116/82  Pulse: 66  Temp: 98.8 F (37.1 C)  TempSrc: Oral  Weight: 180 lb (81.6 kg)  Height: 5' 6.8" (1.697 m)   Body mass index is 28.36 kg/m.   Objective:  Physical Exam Vitals signs and nursing note reviewed.  Constitutional:      Appearance: Normal appearance.  HENT:     Head: Normocephalic and atraumatic.  Neck:     Musculoskeletal: Normal range of motion.  Cardiovascular:     Rate and Rhythm: Normal rate and regular rhythm.     Heart sounds: Normal heart sounds.  Pulmonary:     Effort: Pulmonary effort is normal.     Breath sounds: Normal breath sounds.  Skin:    General: Skin is warm.  Neurological:     General: No focal deficit present.     Mental Status: She is alert.  Psychiatric:        Mood and Affect: Mood normal.  Behavior: Behavior normal.         Assessment And Plan:     1. Cervical radiculopathy  Chronic. I will refer her to [facility] for spinal manipulation, she may also benefit from ART. She was also given rx tramadol to use prn mod-severe flares. She is in agreement with treatment plan.   - Ambulatory referral to Chiropractic  2. Muscle spasm  She is encouraged to take magnesium nightly and to stay well hydrated.   - Ambulatory referral to Chiropractic  3. Need for vaccination  - Flu Vaccine QUAD 6+ mos PF IM (Fluarix Quad PF)        Maximino Greenland, MD    THE PATIENT IS ENCOURAGED TO PRACTICE SOCIAL DISTANCING DUE TO THE COVID-19 PANDEMIC.

## 2019-07-12 ENCOUNTER — Ambulatory Visit
Admission: EM | Admit: 2019-07-12 | Discharge: 2019-07-12 | Disposition: A | Discharge status: Home / Self Care | Payer: Managed Care, Other (non HMO) | Attending: Physician Assistant | Admitting: Physician Assistant

## 2019-07-12 ENCOUNTER — Other Ambulatory Visit: Payer: Self-pay

## 2019-07-12 ENCOUNTER — Encounter: Payer: Self-pay | Admitting: Emergency Medicine

## 2019-07-12 DIAGNOSIS — J029 Acute pharyngitis, unspecified: Secondary | ICD-10-CM | POA: Diagnosis not present

## 2019-07-12 DIAGNOSIS — Z20828 Contact with and (suspected) exposure to other viral communicable diseases: Secondary | ICD-10-CM | POA: Diagnosis not present

## 2019-07-12 MED ORDER — LIDOCAINE VISCOUS HCL 2 % MT SOLN: OROMUCOSAL | 0 refills | Status: DC

## 2019-07-12 NOTE — ED Triage Notes (Signed)
Pt presents to [facility] for assessment of sore throat, headaches, mild cough, body aches, and fatigues x 6 days.  C/o nausea, denies diarrhea.

## 2019-07-12 NOTE — ED Provider Notes (Signed)
[facility]    CSN: [id number] Arrival date & time: 07/12/19  1252      History   Chief Complaint Chief Complaint  Patient presents with  . APPT: 1:00pm  . URI    HPI Rachel Zamora is a 48 y.o. female.   48 year old female comes in for 6 day of URI symptoms. Has had cough, sore throat, nasal congestion, rhinorrhea. Has had body aches, headache. Nausea without vomiting, loss of appetite. Denies abdominal pain, diarrhea. Denies shortness of breath, loss of taste/smell. Child with URI symptoms.      Past Medical History:  Diagnosis Date  . Abnormal Pap smear   . AMA (advanced maternal age) multigravida 35+   . Anemia   . Cervical radiculopathy    right- sided  . Gestational diabetes    diet controlled  . [place] multiparity in labor and delivery, antepartum   . Headache   . History of hiatal hernia   . HPV (human papilloma virus) anogenital infection     Patient Active Problem List   Diagnosis Date Noted  . Cervical radiculopathy 04/13/2018    Past Surgical History:  Procedure Laterality Date  . ANTERIOR CERVICAL DECOMPRESSION/DISCECTOMY FUSION 4 LEVELS N/A 04/13/2018   Procedure: ANTERIOR CERVICAL DECOMPRESSION FUSION, CERVICAL 4-5, CERVICAL 5-6, CERVICAL 6-7 WITH INSTRUMENTATION AND ALLOGRAFT;  Surgeon: Estill Bamberg, MD;  Location: [facility] OR;  Service: Orthopedics;  Laterality: N/A;  . CHOLECYSTECTOMY    . DILATION AND CURETTAGE OF UTERUS    . GYNECOLOGIC CRYOSURGERY    . HERNIA REPAIR    . TAB      OB History    Gravida  12   Para  9   Term  9   Preterm      AB  3   Living  9     SAB  2   TAB  0   Ectopic      Multiple      Live Births  9            Home Medications    Prior to Admission medications   Medication Sig Start Date End Date Taking? Authorizing Provider  cyclobenzaprine (FLEXERIL) 5 MG tablet TAKE 1 2 TABLETS BY MOUTH AT BEDTIME AS NEEDED 12/07/18  Yes [provider]  ferrous sulfate 325 (65 FE)  MG tablet Take 325 mg by mouth once a week.   Yes [provider]  gabapentin (NEURONTIN) 300 MG capsule Take 1 capsule (300 mg total) by mouth at bedtime. 12/25/18  Yes Dorothyann Peng, MD  Multiple Vitamin (MULTIVITAMIN WITH MINERALS) TABS tablet Take 1 tablet by mouth at bedtime.   Yes [provider]  traMADol (ULTRAM) 50 MG tablet Take 1 tablet (50 mg total) by mouth every 6 (six) hours as needed. 07/02/19 07/01/20 Yes Dorothyann Peng, MD  lidocaine (XYLOCAINE) 2 % solution 5-15 mL gurgle as needed 07/12/19   Belinda Fisher, PA-C  methocarbamol (ROBAXIN) 500 MG tablet  07/07/18   [provider]    Family History Family History  Problem Relation Age of Onset  . Diabetes Father   . Hypertension Father   . Hypertension Mother   . Diabetes Mother   . Breast cancer Neg Hx     Social History Social History   Tobacco Use  . Smoking status: Never Smoker  . Smokeless tobacco: Never Used  Substance Use Topics  . Alcohol use: No  . Drug use: No  Allergies   Patient has no known allergies.   Review of Systems Review of Systems  Reason unable to perform ROS: See HPI as above.     Physical Exam Triage Vital Signs ED Triage Vitals  Enc Vitals Group     BP 07/12/19 1311 (!) 145/87     Pulse Rate 07/12/19 1311 81     Resp 07/12/19 1311 16     Temp 07/12/19 1311 97.8 F (36.6 C)     Temp Source 07/12/19 1311 Temporal     SpO2 07/12/19 1311 98 %     Weight --      Height --      Head Circumference --      Peak Flow --      Pain Score 07/12/19 1312 5     Pain Loc --      Pain Edu? --      Excl. in [place]? --    No data found.  Updated Vital Signs BP (!) 145/87 (BP Location: Right Arm)   Pulse 81   Temp 97.8 F (36.6 C) (Temporal)   Resp 16   LMP 06/28/2019   SpO2 98%   Physical Exam Constitutional:      General: She is not in acute distress.    Appearance: Normal appearance. She is not ill-appearing, toxic-appearing or diaphoretic.  HENT:      Head: Normocephalic and atraumatic.     Mouth/Throat:     Mouth: Mucous membranes are moist.     Pharynx: Oropharynx is clear. Uvula midline.  Cardiovascular:     Rate and Rhythm: Normal rate and regular rhythm.     Heart sounds: Normal heart sounds. No murmur. No friction rub. No gallop.   Pulmonary:     Effort: Pulmonary effort is normal. No accessory muscle usage, prolonged expiration, respiratory distress or retractions.     Comments: Lungs clear to auscultation without adventitious lung sounds. Musculoskeletal:     Cervical back: Normal range of motion and neck supple.  Neurological:     General: No focal deficit present.     Mental Status: She is alert and oriented to person, place, and time.    UC Treatments / Results  Labs (all labs ordered are listed, but only abnormal results are displayed) Labs Reviewed  NOVEL CORONAVIRUS, NAA    EKG   Radiology No results found.  Procedures Procedures (including critical care time)  Medications Ordered in UC Medications - No data to display  Initial Impression / Assessment and Plan / UC Course  I have reviewed the triage vital signs and the nursing notes.  Pertinent labs & imaging results that were available during my care of the patient were reviewed by me and considered in my medical decision making (see chart for details).    COVID PCR test ordered. Patient to quarantine until testing results return. No alarming signs on exam.  Patient speaking in full sentences without respiratory distress.  Symptomatic treatment discussed.  Push fluids.  Return precautions given.  Patient expresses understanding and agrees to plan.  Final Clinical Impressions(s) / UC Diagnoses   Final diagnoses:  Sore throat    ED Prescriptions    Medication Sig Dispense Auth. Provider   lidocaine (XYLOCAINE) 2 % solution 5-15 mL gurgle as needed 150 mL Ok Edwards, PA-C     PDMP not reviewed this encounter.   Ok Edwards, PA-C 07/12/19  1511

## 2019-07-12 NOTE — Discharge Instructions (Addendum)
COVID PCR testing ordered. I would like you to quarantine until testing results. Start lidocaine for sore throat, do not eat or drink for the next 40 mins after use as it can stunt your gag reflex.You can take over the counter flonase/nasacort to help with nasal congestion/drainage. If experiencing shortness of breath, trouble breathing, go to the emergency department for further evaluation needed.  

## 2019-07-14 ENCOUNTER — Other Ambulatory Visit: Payer: Self-pay | Admitting: Internal Medicine

## 2019-07-14 LAB — NOVEL CORONAVIRUS, NAA: SARS-CoV-2, NAA: NOT DETECTED

## 2019-10-04 ENCOUNTER — Telehealth: Payer: Self-pay | Admitting: Internal Medicine

## 2019-10-04 NOTE — Telephone Encounter (Signed)
CALLED PATIENT LMOVM TO CALL OFFICE BACK IF SHE STILL NEEDS THE APPT FOR HER STOMACH

## 2019-12-31 ENCOUNTER — Other Ambulatory Visit: Payer: Self-pay | Admitting: Internal Medicine

## 2019-12-31 ENCOUNTER — Encounter: Payer: Self-pay | Admitting: Internal Medicine

## 2019-12-31 ENCOUNTER — Other Ambulatory Visit: Payer: Self-pay

## 2019-12-31 ENCOUNTER — Ambulatory Visit (INDEPENDENT_AMBULATORY_CARE_PROVIDER_SITE_OTHER): Payer: 59 | Admitting: Internal Medicine

## 2019-12-31 VITALS — BP 110/82 | HR 83 | Temp 98.8°F | Ht 65.6 in | Wt 183.6 lb

## 2019-12-31 DIAGNOSIS — Z0000 Encounter for general adult medical examination without abnormal findings: Secondary | ICD-10-CM | POA: Diagnosis not present

## 2019-12-31 DIAGNOSIS — Z1211 Encounter for screening for malignant neoplasm of colon: Secondary | ICD-10-CM | POA: Diagnosis not present

## 2019-12-31 NOTE — Progress Notes (Signed)
This visit occurred during the SARS-CoV-2 public health emergency.  Safety protocols were in place, including screening questions prior to the visit, additional usage of staff PPE, and extensive cleaning of exam room while observing appropriate contact time as indicated for disinfecting solutions.  Subjective:     Patient ID: Rachel Zamora , female    DOB: [birth year] , 49 y.o.   MRN: [id number]   Chief Complaint  Patient presents with  . Annual Exam    HPI  She is here today for a full physical examination. She is followed by Dr. Paula Compton for her GYN care. She has no specific concerns or complaints at this time.     Past Medical History:  Diagnosis Date  . Abnormal Pap smear   . AMA (advanced maternal age) multigravida 24+   . Anemia   . Cervical radiculopathy    right- sided  . Gestational diabetes    diet controlled  . [place] multiparity in labor and delivery, antepartum   . Headache   . History of hiatal hernia   . HPV (human papilloma virus) anogenital infection      Family History  Problem Relation Age of Onset  . Diabetes Father   . Hypertension Father   . Diabetes Mother   . Breast cancer Neg Hx      Current Outpatient Medications:  .  cyclobenzaprine (FLEXERIL) 5 MG tablet, TAKE 1 2 TABLETS BY MOUTH AT BEDTIME AS NEEDED, Disp: , Rfl:  .  ferrous sulfate 325 (65 FE) MG tablet, Take 325 mg by mouth once a week., Disp: , Rfl:  .  gabapentin (NEURONTIN) 300 MG capsule, TAKE 1 CAPSULE BY MOUTH EVERYDAY AT BEDTIME, Disp: 90 capsule, Rfl: 1 .  Multiple Vitamin (MULTIVITAMIN WITH MINERALS) TABS tablet, Take 1 tablet by mouth at bedtime., Disp: , Rfl:  .  traMADol (ULTRAM) 50 MG tablet, Take 1 tablet (50 mg total) by mouth every 6 (six) hours as needed., Disp: 20 tablet, Rfl: 0   No Known Allergies    The patient states she uses none for birth control. Last LMP was Patient's last menstrual period was 12/28/2019.. Negative for Dysmenorrhea  Negative for:  breast discharge, breast lump(s), breast pain and breast self exam. Associated symptoms include abnormal vaginal bleeding. Pertinent negatives include abnormal bleeding (hematology), anxiety, decreased libido, depression, difficulty falling sleep, dyspareunia, history of infertility, nocturia, sexual dysfunction, sleep disturbances, urinary incontinence, urinary urgency, vaginal discharge and vaginal itching. Diet regular.The patient states her exercise level is  intermittent.   . The patient's tobacco use is:  Social History   Tobacco Use  Smoking Status Never Smoker  Smokeless Tobacco Never Used  . She has been exposed to passive smoke. The patient's alcohol use is:  Social History   Substance and Sexual Activity  Alcohol Use No    Review of Systems  Constitutional: Negative.   HENT: Negative.   Eyes: Negative.   Respiratory: Negative.   Cardiovascular: Negative.   Endocrine: Negative.   Genitourinary: Negative.   Musculoskeletal: Negative.   Skin: Negative.   Allergic/Immunologic: Negative.   Neurological: Negative.   Hematological: Negative.   Psychiatric/Behavioral: Negative.      Today's Vitals   12/31/19 1439  BP: 110/82  Pulse: 83  Temp: 98.8 F (37.1 C)  TempSrc: Oral  Weight: 183 lb 9.6 oz (83.3 kg)  Height: 5' 5.6" (1.666 m)   Body mass index is 30 kg/m.   Objective:  Physical Exam Vitals and nursing note reviewed.  Constitutional:      Appearance: Normal appearance.  HENT:     Head: Normocephalic and atraumatic.     Right Ear: Tympanic membrane, ear canal and external ear normal.     Left Ear: Tympanic membrane, ear canal and external ear normal.     Nose:     Comments: Deferred, masked    Mouth/Throat:     Comments: Deferred, masked Eyes:     Extraocular Movements: Extraocular movements intact.     Conjunctiva/sclera: Conjunctivae normal.     Pupils: Pupils are equal, round, and reactive to light.  Cardiovascular:     Rate and Rhythm: Normal  rate and regular rhythm.     Pulses: Normal pulses.     Heart sounds: Normal heart sounds.  Pulmonary:     Effort: Pulmonary effort is normal.     Breath sounds: Normal breath sounds.  Chest:     Breasts: Tanner Score is 5.        Right: Normal.        Left: Normal.  Abdominal:     General: Abdomen is flat. Bowel sounds are normal.     Palpations: Abdomen is soft.  Genitourinary:    Comments: deferred Musculoskeletal:        General: Normal range of motion.     Cervical back: Normal range of motion and neck supple.  Skin:    General: Skin is warm and dry.  Neurological:     General: No focal deficit present.     Mental Status: She is alert and oriented to person, place, and time.  Psychiatric:        Mood and Affect: Mood normal.        Behavior: Behavior normal.         Assessment And Plan:     1. Routine general medical examination at health care facility  A full exam was performed.  Importance of monthly self breast exams was discussed with the patient. PATIENT IS ADVISED TO GET 30-45 MINUTES REGULAR EXERCISE NO LESS THAN FOUR TO FIVE DAYS PER WEEK - BOTH WEIGHTBEARING EXERCISES AND AEROBIC ARE RECOMMENDED.  SHE IS ADVISED TO FOLLOW A HEALTHY DIET WITH AT LEAST SIX FRUITS/VEGGIES PER DAY, DECREASE INTAKE OF RED MEAT, AND TO INCREASE FISH INTAKE TO TWO DAYS PER WEEK.  MEATS/FISH SHOULD NOT BE FRIED, BAKED OR BROILED IS PREFERABLE.  I SUGGEST WEARING SPF 50 SUNSCREEN ON EXPOSED PARTS AND ESPECIALLY WHEN IN THE DIRECT SUNLIGHT FOR AN EXTENDED PERIOD OF TIME.  PLEASE AVOID FAST FOOD RESTAURANTS AND INCREASE YOUR WATER INTAKE.  - CMP14+EGFR - CBC - Lipid panel - Hemoglobin A1c - Hepatitis C antibody  2. Screen for colon cancer  She agrees to GI referral for CRC screening.   - Ambulatory referral to Gastroenterology   Maximino Greenland, MD    THE PATIENT IS ENCOURAGED TO PRACTICE SOCIAL DISTANCING DUE TO THE COVID-19 PANDEMIC.

## 2019-12-31 NOTE — Patient Instructions (Signed)
Health Maintenance, Female Adopting a healthy lifestyle and getting preventive care are important in promoting health and wellness. Ask your health care provider about:  The right schedule for you to have regular tests and exams.  Things you can do on your own to prevent diseases and keep yourself healthy. What should I know about diet, weight, and exercise? Eat a healthy diet   Eat a diet that includes plenty of vegetables, fruits, low-fat dairy products, and lean protein.  Do not eat a lot of foods that are high in solid fats, added sugars, or sodium. Maintain a healthy weight Body mass index (BMI) is used to identify weight problems. It estimates body fat based on height and weight. Your health care provider can help determine your BMI and help you achieve or maintain a healthy weight. Get regular exercise Get regular exercise. This is one of the most important things you can do for your health. Most adults should:  Exercise for at least 150 minutes each week. The exercise should increase your heart rate and make you sweat (moderate-intensity exercise).  Do strengthening exercises at least twice a week. This is in addition to the moderate-intensity exercise.  Spend less time sitting. Even light physical activity can be beneficial. Watch cholesterol and blood lipids Have your blood tested for lipids and cholesterol at 50 years of age, then have this test every 5 years. Have your cholesterol levels checked more often if:  Your lipid or cholesterol levels are high.  You are older than 50 years of age.  You are at high risk for heart disease. What should I know about cancer screening? Depending on your health history and family history, you may need to have cancer screening at various ages. This may include screening for:  Breast cancer.  Cervical cancer.  Colorectal cancer.  Skin cancer.  Lung cancer. What should I know about heart disease, diabetes, and high blood  pressure? Blood pressure and heart disease  High blood pressure causes heart disease and increases the risk of stroke. This is more likely to develop in people who have high blood pressure readings, are of African descent, or are overweight.  Have your blood pressure checked: ? Every 3-5 years if you are 18-39 years of age. ? Every year if you are 40 years old or older. Diabetes Have regular diabetes screenings. This checks your fasting blood sugar level. Have the screening done:  Once every three years after age 40 if you are at a normal weight and have a low risk for diabetes.  More often and at a younger age if you are overweight or have a high risk for diabetes. What should I know about preventing infection? Hepatitis B If you have a higher risk for hepatitis B, you should be screened for this virus. Talk with your health care provider to find out if you are at risk for hepatitis B infection. Hepatitis C Testing is recommended for:  Everyone born from 1945 through 1965.  Anyone with known risk factors for hepatitis C. Sexually transmitted infections (STIs)  Get screened for STIs, including gonorrhea and chlamydia, if: ? You are sexually active and are younger than 50 years of age. ? You are older than 50 years of age and your health care provider tells you that you are at risk for this type of infection. ? Your sexual activity has changed since you were last screened, and you are at increased risk for chlamydia or gonorrhea. Ask your health care provider if   you are at risk.  Ask your health care provider about whether you are at high risk for HIV. Your health care provider may recommend a prescription medicine to help prevent HIV infection. If you choose to take medicine to prevent HIV, you should first get tested for HIV. You should then be tested every 3 months for as long as you are taking the medicine. Pregnancy  If you are about to stop having your period (premenopausal) and  you may become pregnant, seek counseling before you get pregnant.  Take 400 to 800 micrograms (mcg) of folic acid every day if you become pregnant.  Ask for birth control (contraception) if you want to prevent pregnancy. Osteoporosis and menopause Osteoporosis is a disease in which the bones lose minerals and strength with aging. This can result in bone fractures. If you are 65 years old or older, or if you are at risk for osteoporosis and fractures, ask your health care provider if you should:  Be screened for bone loss.  Take a calcium or vitamin D supplement to lower your risk of fractures.  Be given hormone replacement therapy (HRT) to treat symptoms of menopause. Follow these instructions at home: Lifestyle  Do not use any products that contain nicotine or tobacco, such as cigarettes, e-cigarettes, and chewing tobacco. If you need help quitting, ask your health care provider.  Do not use street drugs.  Do not share needles.  Ask your health care provider for help if you need support or information about quitting drugs. Alcohol use  Do not drink alcohol if: ? Your health care provider tells you not to drink. ? You are pregnant, may be pregnant, or are planning to become pregnant.  If you drink alcohol: ? Limit how much you use to 0-1 drink a day. ? Limit intake if you are breastfeeding.  Be aware of how much alcohol is in your drink. In the [place] one drink equals one 12 oz bottle of beer (355 mL), one 5 oz glass of wine (148 mL), or one 1 oz glass of hard liquor (44 mL). General instructions  Schedule regular health, dental, and eye exams.  Stay current with your vaccines.  Tell your health care provider if: ? You often feel depressed. ? You have ever been abused or do not feel safe at home. Summary  Adopting a healthy lifestyle and getting preventive care are important in promoting health and wellness.  Follow your health care provider's instructions about healthy  diet, exercising, and getting tested or screened for diseases.  Follow your health care provider's instructions on monitoring your cholesterol and blood pressure. This information is not intended to replace advice given to you by your health care provider. Make sure you discuss any questions you have with your health care provider. Document Revised: 07/05/2018 Document Reviewed: 07/05/2018 Elsevier Patient Education  2020 [employer]  

## 2020-01-01 LAB — CMP14+EGFR
ALT: 15 IU/L (ref 0–32)
AST: 20 IU/L (ref 0–40)
Albumin/Globulin Ratio: 1.3 (ref 1.2–2.2)
Albumin: 4.3 g/dL (ref 3.8–4.8)
Alkaline Phosphatase: 78 IU/L (ref 48–121)
BUN/Creatinine Ratio: 11 (ref 9–23)
BUN: 8 mg/dL (ref 6–24)
Bilirubin Total: <0.2 mg/dL (ref 0.0–1.2)
CO2: 23 mmol/L (ref 20–29)
Calcium: 9.2 mg/dL (ref 8.7–10.2)
Chloride: 102 mmol/L (ref 96–106)
Creatinine, Ser: 0.76 mg/dL (ref 0.57–1.00)
GFR calc Af Amer: 107 mL/min/{1.73_m2} (ref >59)
GFR calc non Af Amer: 92 mL/min/{1.73_m2} (ref >59)
Globulin, Total: 3.3 g/dL (ref 1.5–4.5)
Glucose: 81 mg/dL (ref 65–99)
Potassium: 4.4 mmol/L (ref 3.5–5.2)
Sodium: 138 mmol/L (ref 134–144)
Total Protein: 7.6 g/dL (ref 6.0–8.5)

## 2020-01-01 LAB — CBC
Hematocrit: 41.3 % (ref 34.0–46.6)
Hemoglobin: 13.5 g/dL (ref 11.1–15.9)
MCH: 29.3 pg (ref 26.6–33.0)
MCHC: 32.7 g/dL (ref 31.5–35.7)
MCV: 90 fL (ref 79–97)
Platelets: 330 10*3/uL (ref 150–450)
RBC: 4.6 x10E6/uL (ref 3.77–5.28)
RDW: 12.9 % (ref 11.7–15.4)
WBC: 6.6 10*3/uL (ref 3.4–10.8)

## 2020-01-01 LAB — LIPID PANEL
Chol/HDL Ratio: 3 ratio (ref 0.0–4.4)
Cholesterol, Total: 137 mg/dL (ref 100–199)
HDL: 45 mg/dL (ref >39)
LDL Chol Calc (NIH): 79 mg/dL (ref 0–99)
Triglycerides: 64 mg/dL (ref 0–149)
VLDL Cholesterol Cal: 13 mg/dL (ref 5–40)

## 2020-01-01 LAB — HEPATITIS C ANTIBODY: Hep C Virus Ab: <0.1 s/co ratio (ref 0.0–0.9)

## 2020-01-01 LAB — HEMOGLOBIN A1C
Est. average glucose Bld gHb Est-mCnc: 114 mg/dL
Hgb A1c MFr Bld: 5.6 % (ref 4.8–5.6)

## 2020-01-01 NOTE — Telephone Encounter (Signed)
Is it ok for the tramadol refill

## 2020-01-07 MED ORDER — TRAMADOL HCL 50 MG PO TABS: 50.0000 mg | ORAL_TABLET | Freq: Four times a day (QID) | ORAL | 0 refills | Status: DC | PRN

## 2020-03-29 ENCOUNTER — Other Ambulatory Visit: Payer: Self-pay | Admitting: Internal Medicine

## 2020-04-01 ENCOUNTER — Other Ambulatory Visit: Payer: Self-pay | Admitting: Internal Medicine

## 2020-04-01 NOTE — Telephone Encounter (Signed)
Ultram refill  

## 2020-04-02 ENCOUNTER — Ambulatory Visit (INDEPENDENT_AMBULATORY_CARE_PROVIDER_SITE_OTHER): Payer: 59 | Admitting: Internal Medicine

## 2020-04-02 ENCOUNTER — Encounter: Payer: Self-pay | Admitting: Internal Medicine

## 2020-04-02 ENCOUNTER — Other Ambulatory Visit: Payer: Self-pay

## 2020-04-02 VITALS — BP 112/74 | HR 82 | Temp 98.6°F | Ht 65.6 in | Wt 181.1 lb

## 2020-04-02 DIAGNOSIS — M62838 Other muscle spasm: Secondary | ICD-10-CM | POA: Diagnosis not present

## 2020-04-02 DIAGNOSIS — M5412 Radiculopathy, cervical region: Secondary | ICD-10-CM

## 2020-04-02 DIAGNOSIS — Z23 Encounter for immunization: Secondary | ICD-10-CM

## 2020-04-02 MED ORDER — CYCLOBENZAPRINE HCL 5 MG PO TABS: ORAL_TABLET | ORAL | 0 refills | Status: DC

## 2020-04-02 MED ORDER — TRAMADOL HCL 50 MG PO TABS: 50.0000 mg | ORAL_TABLET | Freq: Two times a day (BID) | ORAL | 0 refills | Status: DC | PRN

## 2020-04-02 NOTE — Telephone Encounter (Signed)
Tramadol refill

## 2020-04-02 NOTE — Progress Notes (Signed)
imm I,Katawbba Wiggins,acting as a Neurosurgeon for Gwynneth Aliment, MD.,have documented all relevant documentation on the behalf of Gwynneth Aliment, MD,as directed by  Gwynneth Aliment, MD while in the presence of Gwynneth Aliment, MD. This visit occurred during the SARS-CoV-2 public health emergency.  Safety protocols were in place, including screening questions prior to the visit, additional usage of staff PPE, and extensive cleaning of exam room while observing appropriate contact time as indicated for disinfecting solutions.  Subjective:     Patient ID: Rachel Zamora , female    DOB: 1970/12/04 , 49 y.o.   MRN: [id number]   Chief Complaint  Patient presents with  . Back Pain    HPI  The patient is here today for evaluation of neck pain.  The patient declines to have an injection to help with her pain. She has been followed by Ortho in the past. She underwent anterior cervical decompression fusion Sept 2019. She has not contacted Ortho for her recent symptoms. She c/o neck pain that radiates to UE. Denies LE weakness/paresthesias.     Past Medical History:  Diagnosis Date  . Abnormal Pap smear   . AMA (advanced maternal age) multigravida 35+   . Anemia   . Cervical radiculopathy    right- sided  . Gestational diabetes    diet controlled  . Grand multiparity in labor and delivery, antepartum   . Headache   . History of hiatal hernia   . HPV (human papilloma virus) anogenital infection      Family History  Problem Relation Age of Onset  . Diabetes Father   . Hypertension Father   . Diabetes Mother   . Breast cancer Neg Hx      Current Outpatient Medications:  .  ferrous sulfate 325 (65 FE) MG tablet, Take 325 mg by mouth once a week., Disp: , Rfl:  .  gabapentin (NEURONTIN) 300 MG capsule, TAKE 1 CAPSULE BY MOUTH EVERYDAY AT BEDTIME, Disp: 90 capsule, Rfl: 1 .  Multiple Vitamin (MULTIVITAMIN WITH MINERALS) TABS tablet, Take 1 tablet by mouth at bedtime., Disp: , Rfl:  .   traMADol (ULTRAM) 50 MG tablet, Take 1 tablet (50 mg total) by mouth every 12 (twelve) hours as needed., Disp: 30 tablet, Rfl: 0 .  cyclobenzaprine (FLEXERIL) 5 MG tablet, Take 1-2 tabs po qpm prn, Disp: 30 tablet, Rfl: 0   No Known Allergies   Review of Systems  Constitutional: Negative.   Respiratory: Negative.   Cardiovascular: Negative.   Gastrointestinal: Negative.   Musculoskeletal: Positive for neck pain.  Psychiatric/Behavioral: Negative.   All other systems reviewed and are negative.    Today's Vitals   04/02/20 1459  BP: 112/74  Pulse: 82  Temp: 98.6 F (37 C)  TempSrc: Oral  Weight: 181 lb 1.6 oz (82.1 kg)  Height: 5' 5.6" (1.666 m)  PainSc: 5   PainLoc: Back   Body mass index is 29.59 kg/m.   Objective:  Physical Exam Vitals and nursing note reviewed.  Constitutional:      Appearance: Normal appearance. She is obese.  HENT:     Head: Normocephalic and atraumatic.  Cardiovascular:     Rate and Rhythm: Normal rate and regular rhythm.     Heart sounds: Normal heart sounds.  Pulmonary:     Breath sounds: Normal breath sounds.  Musculoskeletal:     Cervical back: Normal range of motion. Tenderness present.  Skin:    General: Skin is warm.  Neurological:  General: No focal deficit present.     Mental Status: She is alert and oriented to person, place, and time.         Assessment And Plan:     1. Cervical radiculopathy Comments: She is encouraged to consider chiropractic therapy and dry needling. She was given rx tramadol. CSRS reviewed as well.   2. Muscle spasm Comments: She was given rx cyclobenzaprine to take nightly as needed.   3. Need for vaccination Comments: She was given flu vaccine to update immunization history.  - Flu Vaccine QUAD 6+ mos PF IM (Fluarix Quad PF)     Patient was given opportunity to ask questions. Patient verbalized understanding of the plan and was able to repeat key elements of the plan. All questions were  answered to their satisfaction.  Gwynneth Aliment, MD   I, Gwynneth Aliment, MD, have reviewed all documentation for this visit. The documentation on 04/06/20 for the exam, diagnosis, procedures, and orders are all accurate and complete.  THE PATIENT IS ENCOURAGED TO PRACTICE SOCIAL DISTANCING DUE TO THE COVID-19 PANDEMIC.

## 2020-04-06 DIAGNOSIS — M62838 Other muscle spasm: Secondary | ICD-10-CM | POA: Insufficient documentation

## 2020-04-06 NOTE — Patient Instructions (Signed)

## 2020-05-09 ENCOUNTER — Encounter: Payer: Self-pay | Admitting: Internal Medicine

## 2020-05-09 LAB — HM COLONOSCOPY

## 2020-07-01 ENCOUNTER — Ambulatory Visit (INDEPENDENT_AMBULATORY_CARE_PROVIDER_SITE_OTHER): Payer: 59 | Admitting: Internal Medicine

## 2020-07-01 ENCOUNTER — Other Ambulatory Visit: Payer: Self-pay

## 2020-07-01 ENCOUNTER — Encounter: Payer: Self-pay | Admitting: Internal Medicine

## 2020-07-01 VITALS — BP 114/72 | HR 84 | Temp 98.2°F | Ht 65.6 in | Wt 183.0 lb

## 2020-07-01 DIAGNOSIS — Z79899 Other long term (current) drug therapy: Secondary | ICD-10-CM | POA: Diagnosis not present

## 2020-07-01 DIAGNOSIS — M5412 Radiculopathy, cervical region: Secondary | ICD-10-CM | POA: Diagnosis not present

## 2020-07-01 DIAGNOSIS — Z6829 Body mass index (BMI) 29.0-29.9, adult: Secondary | ICD-10-CM | POA: Diagnosis not present

## 2020-07-01 NOTE — Patient Instructions (Addendum)
Cervical Radiculopathy  Cervical radiculopathy happens when a nerve in the neck (a cervical nerve) is pinched or bruised. This condition can happen because of an injury to the cervical spine (vertebrae) in the neck, or as part of the normal aging process. Pressure on the cervical nerves can cause pain or numbness that travels from the neck all the way down into the arm and fingers. Usually, this condition gets better with rest. Treatment may be needed if the condition does not improve. What are the causes? This condition may be caused by:  A neck injury.  A bulging (herniated) disk.  Muscle spasms.  Muscle tightness in the neck because of overuse.  Arthritis.  Breakdown or degeneration in the bones and joints of the spine (spondylosis) due to aging.  Bone spurs that may develop near the cervical nerves. What are the signs or symptoms? Symptoms of this condition include:  Pain. The pain may travel from the neck to the arm and hand. The pain can be severe or irritating. It may be worse when you move your neck.  Numbness or tingling in your arm or hand.  Weakness in the affected arm and hand, in severe cases. How is this diagnosed? This condition may be diagnosed based on your symptoms, your medical history, and a physical exam. You may also have tests, including:  X-rays.  A CT scan.  An MRI.  An electromyogram (EMG).  Nerve conduction tests. How is this treated? In many cases, treatment is not needed for this condition. With rest, the condition usually gets better over time. If treatment is needed, options may include:  Wearing a soft neck collar (cervical collar) for short periods of time, as told by your health care provider.  Doing physical therapy to strengthen your neck muscles.  Taking medicines, such as NSAIDs or oral corticosteroids.  Having spinal injections, in severe cases.  Having surgery. This may be needed if other treatments do not help. Different types  of surgery may be done depending on the cause of this condition. Follow these instructions at home: If you have a cervical collar:  Wear it as told by your health care provider. Remove it only as told by your health care provider.  Ask your health care provider if you can remove the collar for cleaning and bathing. If you are allowed to remove the collar for cleaning or bathing: ? Follow instructions from your health care provider about how to remove the collar safely. ? Clean the collar by wiping it with mild soap and water and drying it completely. ? Take out any removable pads in the collar every 1-2 days, and wash them by hand with soap and water. Let them air-dry completely before you put them back in the collar. ? Check your skin under the collar for irritation or sores. If you see any, tell your health care provider. Managing pain      Take over-the-counter and prescription medicines only as told by your health care provider.  If directed, put ice on the affected area. ? If you have a soft neck collar, remove it as told by your health care provider. ? Put ice in a plastic bag. ? Place a towel between your skin and the bag. ? Leave the ice on for 20 minutes, 2-3 times a day.  If applying ice does not help, you can try using heat. Use the heat source that your health care provider recommends, such as a moist heat pack or a heating pad. ?  Place a towel between your skin and the heat source. ? Leave the heat on for 20-30 minutes. ? Remove the heat if your skin turns bright red. This is especially important if you are unable to feel pain, heat, or cold. You may have a greater risk of getting burned.  Try a gentle neck and shoulder massage to help relieve symptoms. Activity  Rest as needed.  Return to your normal activities as told by your health care provider. Ask your health care provider what activities are safe for you.  Do stretching and strengthening exercises as told by  your health care provider or physical therapist.  Do not lift anything that is heavier than 10 lb (4.5 kg) until your health care provider tells you that it is safe. General instructions  Use a flat pillow when you sleep.  Do not drive while wearing a cervical collar. If you do not have a cervical collar, ask your health care provider if it is safe to drive while your neck heals.  Ask your health care provider if the medicine prescribed to you requires you to avoid driving or using heavy machinery.  Do not use any products that contain nicotine or tobacco, such as cigarettes, e-cigarettes, and chewing tobacco. These can delay healing. If you need help quitting, ask your health care provider.  Keep all follow-up visits as told by your health care provider. This is important. Contact a health care provider if:  Your condition does not improve with treatment. Get help right away if:  Your pain gets much worse and cannot be controlled with medicines.  You have weakness or numbness in your hand, arm, face, or leg.  You have a high fever.  You have a stiff, rigid neck.  You lose control of your bowels or your bladder (have incontinence).  You have trouble with walking, balance, or speaking. Summary  Cervical radiculopathy happens when a nerve in the neck is pinched or bruised.  A nerve can get pinched from a bulging disk, arthritis, muscle spasms, or an injury to the neck.  Symptoms include pain, tingling, or numbness radiating from the neck into the arm or hand. Weakness can also occur in severe cases.  Treatment may include rest, wearing a cervical collar, and physical therapy. Medicines may be prescribed to help with pain. In severe cases, injections or surgery may be needed. This information is not intended to replace advice given to you by your health care provider. Make sure you discuss any questions you have with your health care provider. Document Revised: 06/02/2018  Document Reviewed: 06/02/2018 Elsevier Patient Education  2020 [employer]  Cervical Radiculopathy  Cervical radiculopathy means that a nerve in the neck (a cervical nerve) is pinched or bruised. This can happen because of an injury to the cervical spine (vertebrae) in the neck, or as a normal part of getting older. This can cause pain or loss of feeling (numbness) that runs from your neck all the way down to your arm and fingers. Often, this condition gets better with rest. Treatment may be needed if the condition does not get better. What are the causes?  A neck injury.  A bulging disk in your spine.  Muscle movements that you cannot control (muscle spasms).  Tight muscles in your neck due to overuse.  Arthritis.  Breakdown in the bones and joints of the spine (spondylosis) due to getting older.  Bone spurs that form near the nerves in the neck. What are the signs or  symptoms?  Pain. The pain may: ? Run from the neck to the arm and hand. ? Be very bad or irritating. ? Be worse when you move your neck.  Loss of feeling or tingling in your arm or hand.  Weakness in your arm or hand, in very bad cases. How is this treated? In many cases, treatment is not needed for this condition. With rest, the condition often gets better over time. If treatment is needed, options may include:  Wearing a soft neck collar (cervical collar) for short periods of time, as told by your doctor.  Doing exercises (physical therapy) to strengthen your neck muscles.  Taking medicines.  Having shots (injections) in your spine, in very bad cases.  Having surgery. This may be needed if other treatments do not help. The type of surgery that is used depends on the cause of your condition. Follow these instructions at home: If you have a soft neck collar:  Wear it as told by your doctor. Remove it only as told by your doctor.  Ask your doctor if you can remove the collar for cleaning and bathing.  If you are allowed to remove the collar for cleaning or bathing: ? Follow instructions from your doctor about how to remove the collar safely. ? Clean the collar by wiping it with mild soap and water and drying it completely. ? Take out any removable pads in the collar every 1-2 days. Wash them by hand with soap and water. Let them air-dry completely before you put them back in the collar. ? Check your skin under the collar for redness or sores. If you see any, tell your doctor. Managing pain      Take over-the-counter and prescription medicines only as told by your doctor.  If told, put ice on the painful area. ? If you have a soft neck collar, remove it as told by your doctor. ? Put ice in a plastic bag. ? Place a towel between your skin and the bag. ? Leave the ice on for 20 minutes, 2-3 times a day.  If using ice does not help, you can try using heat. Use the heat source that your doctor recommends, such as a moist heat pack or a heating pad. ? Place a towel between your skin and the heat source. ? Leave the heat on for 20-30 minutes. ? Remove the heat if your skin turns bright red. This is very important if you are unable to feel pain, heat, or cold. You may have a greater risk of getting burned.  You may try a gentle neck and shoulder rub (massage). Activity  Rest as needed.  Return to your normal activities as told by your doctor. Ask your doctor what activities are safe for you.  Do exercises as told by your doctor or physical therapist.  Do not lift anything that is heavier than 10 lb (4.5 kg) until your doctor tells you that it is safe. General instructions  Use a flat pillow when you sleep.  Do not drive while wearing a soft neck collar. If you do not have a soft neck collar, ask your doctor if it is safe to drive while your neck heals.  Ask your doctor if the medicine prescribed to you requires you to avoid driving or using heavy machinery.  Do not use any  products that contain nicotine or tobacco, such as cigarettes, e-cigarettes, and chewing tobacco. These can delay healing. If you need help quitting, ask your doctor.  Keep all follow-up visits as told by your doctor. This is important. Contact a doctor if:  Your condition does not get better with treatment. Get help right away if:  Your pain gets worse and is not helped with medicine.  You lose feeling or feel weak in your hand, arm, face, or leg.  You have a high fever.  You have a stiff neck.  You cannot control when you poop or pee (have incontinence).  You have trouble with walking, balance, or talking. Summary  Cervical radiculopathy means that a nerve in the neck is pinched or bruised.  A nerve can get pinched from a bulging disk, arthritis, an injury to the neck, or other causes.  Symptoms include pain, tingling, or loss of feeling that goes from the neck into the arm or hand.  Weakness in your arm or hand can happen in very bad cases.  Treatment may include resting, wearing a soft neck collar, and doing exercises. You might need to take medicines for pain. In very bad cases, shots or surgery may be needed. This information is not intended to replace advice given to you by your health care provider. Make sure you discuss any questions you have with your health care provider. Document Revised: 06/02/2018 Document Reviewed: 06/02/2018 Elsevier Patient Education  2020 [employer].

## 2020-07-01 NOTE — Progress Notes (Signed)
I,Katawbba Wiggins,acting as a Education administrator for Maximino Greenland, MD.,have documented all relevant documentation on the behalf of Maximino Greenland, MD,as directed by  Maximino Greenland, MD while in the presence of Maximino Greenland, MD.  This visit occurred during the SARS-CoV-2 public health emergency.  Safety protocols were in place, including screening questions prior to the visit, additional usage of staff PPE, and extensive cleaning of exam room while observing appropriate contact time as indicated for disinfecting solutions.  Subjective:     Patient ID: Rachel Zamora , female    DOB: [birth year] , 49 y.o.   MRN: [id number]   Chief Complaint  Patient presents with   Medication Management    gabapentin    HPI  The patient is here today for f/u neck pain. This is a chronic condition. She has undergone anterior cervical decompression/discectomy fusion 4 levels in 2019. She still has flares on occasion. This is relieved with tramadol prn. She uses this sparingly.     Past Medical History:  Diagnosis Date   Abnormal Pap smear    AMA (advanced maternal age) multigravida 35+    Anemia    Cervical radiculopathy    right- sided   Gestational diabetes    diet controlled   Grand multiparity in labor and delivery, antepartum    Headache    History of hiatal hernia    HPV (human papilloma virus) anogenital infection      Family History  Problem Relation Age of Onset   Diabetes Father    Hypertension Father    Diabetes Mother    Breast cancer Neg Hx      Current Outpatient Medications:    cyclobenzaprine (FLEXERIL) 5 MG tablet, Take 1-2 tabs po qpm prn, Disp: 30 tablet, Rfl: 0   ferrous sulfate 325 (65 FE) MG tablet, Take 325 mg by mouth once a week., Disp: , Rfl:    gabapentin (NEURONTIN) 300 MG capsule, TAKE 1 CAPSULE BY MOUTH EVERYDAY AT BEDTIME, Disp: 90 capsule, Rfl: 1   Multiple Vitamin (MULTIVITAMIN WITH MINERALS) TABS tablet, Take 1 tablet by mouth at bedtime.,  Disp: , Rfl:    traMADol (ULTRAM) 50 MG tablet, Take 1 tablet (50 mg total) by mouth every 12 (twelve) hours as needed., Disp: 30 tablet, Rfl: 0   esomeprazole (NEXIUM) 40 MG capsule, , Disp: , Rfl:    methocarbamol (ROBAXIN) 500 MG tablet, Take by mouth., Disp: , Rfl:    No Known Allergies   Review of Systems  Constitutional: Negative.   Respiratory: Negative.   Cardiovascular: Negative.   Gastrointestinal: Negative.   Psychiatric/Behavioral: Negative.   All other systems reviewed and are negative.    Today's Vitals   07/01/20 1228  BP: 114/72  Pulse: 84  Temp: 98.2 F (36.8 C)  TempSrc: Oral  Weight: 183 lb (83 kg)  Height: 5' 5.6" (1.666 m)  PainSc: 2   PainLoc: Neck   Body mass index is 29.9 kg/m.  Wt Readings from Last 3 Encounters:  07/01/20 183 lb (83 kg)  04/02/20 181 lb 1.6 oz (82.1 kg)  12/31/19 183 lb 9.6 oz (83.3 kg)   Objective:  Physical Exam Vitals and nursing note reviewed.  Constitutional:      Appearance: Normal appearance.  HENT:     Head: Normocephalic and atraumatic.  Cardiovascular:     Rate and Rhythm: Normal rate and regular rhythm.     Heart sounds: Normal heart sounds.  Pulmonary:     Breath sounds:  Normal breath sounds.  Skin:    General: Skin is warm.  Neurological:     General: No focal deficit present.     Mental Status: She is alert and oriented to person, place, and time.         Assessment And Plan:     1. Cervical radiculopathy Comments: Chronic. She will c/w gabapentin. Will use tramadol prn. Encouraged to perform neck exercises regularly. May benefit from Mg supplementation to decrease muscle spasms.   2. Encounter for long-term (current) use of medications - CMP14+EGFR  3. BMI 29.0-29.9,adult  She is encouraged to strive for BMI less than 27 to decrease cardiac risk. Advised to aim for at least 150 minutes of exercise per week.  Patient was given opportunity to ask questions. Patient verbalized understanding of  the plan and was able to repeat key elements of the plan. All questions were answered to their satisfaction.  Maximino Greenland, MD   I, Maximino Greenland, MD, have reviewed all documentation for this visit. The documentation on 07/22/20 for the exam, diagnosis, procedures, and orders are all accurate and complete.  THE PATIENT IS ENCOURAGED TO PRACTICE SOCIAL DISTANCING DUE TO THE COVID-19 PANDEMIC.

## 2020-07-02 LAB — CMP14+EGFR
ALT: 15 IU/L (ref 0–32)
AST: 17 IU/L (ref 0–40)
Albumin/Globulin Ratio: 1.4 (ref 1.2–2.2)
Albumin: 4.3 g/dL (ref 3.8–4.8)
Alkaline Phosphatase: 80 IU/L (ref 44–121)
BUN/Creatinine Ratio: 13 (ref 9–23)
BUN: 9 mg/dL (ref 6–24)
Bilirubin Total: 0.2 mg/dL (ref 0.0–1.2)
CO2: 23 mmol/L (ref 20–29)
Calcium: 9 mg/dL (ref 8.7–10.2)
Chloride: 102 mmol/L (ref 96–106)
Creatinine, Ser: 0.69 mg/dL (ref 0.57–1.00)
GFR calc Af Amer: 117 mL/min/{1.73_m2} (ref >59)
GFR calc non Af Amer: 102 mL/min/{1.73_m2} (ref >59)
Globulin, Total: 3.1 g/dL (ref 1.5–4.5)
Glucose: 79 mg/dL (ref 65–99)
Potassium: 4.4 mmol/L (ref 3.5–5.2)
Sodium: 138 mmol/L (ref 134–144)
Total Protein: 7.4 g/dL (ref 6.0–8.5)

## 2020-07-23 ENCOUNTER — Other Ambulatory Visit: Payer: Self-pay | Admitting: Internal Medicine

## 2020-07-23 ENCOUNTER — Encounter: Payer: Self-pay | Admitting: Internal Medicine

## 2020-07-24 ENCOUNTER — Other Ambulatory Visit: Payer: Self-pay

## 2020-07-24 MED ORDER — CYCLOBENZAPRINE HCL 5 MG PO TABS: ORAL_TABLET | ORAL | 0 refills | Status: DC

## 2020-07-24 MED ORDER — GABAPENTIN 300 MG PO CAPS: ORAL_CAPSULE | ORAL | 1 refills | Status: DC

## 2020-08-05 ENCOUNTER — Other Ambulatory Visit: Payer: Managed Care, Other (non HMO)

## 2020-12-31 ENCOUNTER — Encounter: Payer: 59 | Admitting: Internal Medicine

## 2021-01-07 ENCOUNTER — Encounter: Payer: 59 | Admitting: Nurse Practitioner

## 2021-02-24 ENCOUNTER — Other Ambulatory Visit: Payer: Self-pay | Admitting: Internal Medicine

## 2021-02-25 MED ORDER — CYCLOBENZAPRINE HCL 5 MG PO TABS: ORAL_TABLET | ORAL | 0 refills | Status: DC

## 2021-03-25 ENCOUNTER — Other Ambulatory Visit: Payer: Self-pay

## 2021-03-25 ENCOUNTER — Ambulatory Visit (INDEPENDENT_AMBULATORY_CARE_PROVIDER_SITE_OTHER): Payer: 59 | Admitting: Nurse Practitioner

## 2021-03-25 ENCOUNTER — Encounter: Payer: Self-pay | Admitting: Nurse Practitioner

## 2021-03-25 VITALS — BP 122/70 | HR 81 | Temp 97.9°F | Ht 66.8 in | Wt 181.8 lb

## 2021-03-25 DIAGNOSIS — E663 Overweight: Secondary | ICD-10-CM

## 2021-03-25 DIAGNOSIS — M5412 Radiculopathy, cervical region: Secondary | ICD-10-CM | POA: Diagnosis not present

## 2021-03-25 DIAGNOSIS — Z0000 Encounter for general adult medical examination without abnormal findings: Secondary | ICD-10-CM | POA: Diagnosis not present

## 2021-03-25 DIAGNOSIS — Z6828 Body mass index (BMI) 28.0-28.9, adult: Secondary | ICD-10-CM | POA: Diagnosis not present

## 2021-03-25 MED ORDER — TRAMADOL HCL 50 MG PO TABS: 50.0000 mg | ORAL_TABLET | Freq: Two times a day (BID) | ORAL | 0 refills | Status: AC | PRN

## 2021-03-25 NOTE — Progress Notes (Signed)
I,Tianna Badgett,acting as a Education administrator for [employer], NP.,have documented all relevant documentation on the behalf of [employer], NP,as directed by  Bary Castilla, NP while in the presence of Bary Castilla, NP.  This visit occurred during the SARS-CoV-2 public health emergency.  Safety protocols were in place, including screening questions prior to the visit, additional usage of staff PPE, and extensive cleaning of exam room while observing appropriate contact time as indicated for disinfecting solutions.  Subjective:     Patient ID: Rachel Zamora , female    DOB: 11/30/1970 , 50 y.o.   MRN: [id number]   Chief Complaint  Patient presents with   Annual Exam    HPI  Rachel Zamora is here today for a full physical examination. Rachel Zamora is followed by Dr. Paula Compton for her GYN care. Rachel Zamora has no specific concerns or complaints at this time.  Rachel Zamora will make an appt to get her Pap smear done with her [facility] for later this year.  Diet: Rachel Zamora tries to eat healthy, vegetables, Rachel Zamora does drink diet sods. Lots of water Exercise: Rachel Zamora does stretch and does the elliptical.  LMP: Last one was in June  Drink or smoke: No     Past Medical History:  Diagnosis Date   Abnormal Pap smear    AMA (advanced maternal age) multigravida 35+    Anemia    Cervical radiculopathy    right- sided   Gestational diabetes    diet controlled   Grand multiparity in labor and delivery, antepartum    Headache    History of hiatal hernia    HPV (human papilloma virus) anogenital infection      Family History  Problem Relation Age of Onset   Diabetes Father    Hypertension Father    Diabetes Mother    Breast cancer Neg Hx      Current Outpatient Medications:    cyclobenzaprine (FLEXERIL) 5 MG tablet, Take 1-2 tabs po qpm prn, Disp: 30 tablet, Rfl: 0   esomeprazole (NEXIUM) 40 MG capsule, , Disp: , Rfl:    ferrous sulfate 325 (65 FE) MG tablet, Take 325 mg by mouth once a week., Disp: , Rfl:     gabapentin (NEURONTIN) 300 MG capsule, TAKE 1 CAPSULE BY MOUTH EVERYDAY AT BEDTIME, Disp: 90 capsule, Rfl: 1   methocarbamol (ROBAXIN) 500 MG tablet, Take by mouth., Disp: , Rfl:    Multiple Vitamin (MULTIVITAMIN WITH MINERALS) TABS tablet, Take 1 tablet by mouth at bedtime., Disp: , Rfl:    traMADol (ULTRAM) 50 MG tablet, Take 1 tablet (50 mg total) by mouth every 12 (twelve) hours as needed., Disp: 30 tablet, Rfl: 0   No Known Allergies    The patient states Rachel Zamora uses none for birth control. Last LMP was Patient's last menstrual period was 01/12/2021.. Negative for Dysmenorrhea. Negative for: breast discharge, breast lump(s), breast pain and breast self exam. Associated symptoms include abnormal vaginal bleeding. Pertinent negatives include abnormal bleeding (hematology), anxiety, decreased libido, depression, difficulty falling sleep, dyspareunia, history of infertility, nocturia, sexual dysfunction, sleep disturbances, urinary incontinence, urinary urgency, vaginal discharge and vaginal itching. Diet regular.The patient states her exercise level is    . The patient's tobacco use is:  Social History   Tobacco Use  Smoking Status Never  Smokeless Tobacco Never  . Rachel Zamora has been exposed to passive smoke. The patient's alcohol use is:  Social History   Substance and Sexual Activity  Alcohol Use No  . Additional information: Last pap 3  years ago, next one scheduled for this fall.  Review of Systems  Constitutional: Negative.  Negative for chills and fatigue.  HENT: Negative.  Negative for congestion, postnasal drip, rhinorrhea, sinus pressure and sinus pain.   Eyes: Negative.   Respiratory: Negative.  Negative for cough, shortness of breath and wheezing.   Cardiovascular: Negative.  Negative for chest pain and palpitations.  Gastrointestinal: Negative.  Negative for diarrhea, nausea and vomiting.  Endocrine: Negative.  Negative for polydipsia, polyphagia and polyuria.  Genitourinary:  Negative.   Musculoskeletal:  Positive for back pain. Negative for arthralgias and myalgias.       Chronic back pain   Skin: Negative.   Allergic/Immunologic: Negative.   Neurological: Negative.  Negative for weakness, numbness and headaches.  Hematological: Negative.   Psychiatric/Behavioral: Negative.      Today's Vitals   03/25/21 1026  BP: 122/70  Pulse: 81  Temp: 97.9 F (36.6 C)  TempSrc: Oral  Weight: 181 lb 12.8 oz (82.5 kg)  Height: 5' 6.8" (1.697 m)   Body mass index is 28.64 kg/m.  Wt Readings from Last 3 Encounters:  03/25/21 181 lb 12.8 oz (82.5 kg)  07/01/20 183 lb (83 kg)  04/02/20 181 lb 1.6 oz (82.1 kg)    Objective:  Physical Exam Vitals and nursing note reviewed.  Constitutional:      Appearance: Normal appearance.  HENT:     Head: Normocephalic and atraumatic.     Right Ear: Tympanic membrane, ear canal and external ear normal.     Left Ear: Tympanic membrane, ear canal and external ear normal.     Nose: Nose normal.     Mouth/Throat:     Mouth: Mucous membranes are moist.     Pharynx: Oropharynx is clear.  Eyes:     Extraocular Movements: Extraocular movements intact.     Conjunctiva/sclera: Conjunctivae normal.     Pupils: Pupils are equal, round, and reactive to light.  Cardiovascular:     Rate and Rhythm: Normal rate and regular rhythm.     Pulses: Normal pulses.     Heart sounds: Normal heart sounds.  Pulmonary:     Effort: Pulmonary effort is normal. No respiratory distress.     Breath sounds: Normal breath sounds. No wheezing.  Chest:     Comments: Patient declined  Abdominal:     General: Abdomen is flat. Bowel sounds are normal.     Palpations: Abdomen is soft.     Tenderness: There is no abdominal tenderness.  Genitourinary:    Comments: deferred Musculoskeletal:        General: Normal range of motion.     Cervical back: Normal range of motion and neck supple.  Skin:    General: Skin is warm and dry.     Capillary Refill:  Capillary refill takes less than 2 seconds.  Neurological:     General: No focal deficit present.     Mental Status: Rachel Zamora is alert and oriented to person, place, and time.  Psychiatric:        Mood and Affect: Mood normal.        Behavior: Behavior normal.        Assessment And Plan:     1. Routine general medical examination at health care facility --Patient is here for their annual physical exam and we discussed any changes to medication and medical history.  -Behavior modification was discussed as well as diet and exercise history  -Patient will continue to exercise regularly and modify  their diet.  -Recommendation for yearly physical annuals, immunization and screenings including mammogram and colonoscopy were discussed with the patient.  -Recommended intake of multivitamin, vitamin D and calcium.  -Individualized advise was given to the patient pertaining to their own health history in regards to diet, exercise, medical condition and referrals.  - CBC - Hemoglobin A1c - CMP14+EGFR - Lipid panel  2. Cervical radiculopathy -Rachel Zamora has a hx of back pain surgery  -Rachel Zamora continues to see her back specialist  -Advised patient to take her prescribed as directed  -Advised patient to make appt with specialist.  - traMADol (ULTRAM) 50 MG tablet; Take 1 tablet (50 mg total) by mouth every 12 (twelve) hours as needed.  Dispense: 30 tablet; Refill: 0  3. Overweight with body mass index (BMI) of 28 to 28.9 in adult Advised patient on a healthy diet including avoiding fast food and red meats. Increase the intake of lean meats including grilled chicken and [place].  Drink a lot of water. Decrease intake of fatty foods. Exercise for 30-45 min. 4-5 a week to decrease the risk of cardiac event.   Follow up: if symptoms persist or do not get better.   The patient was encouraged to call or send a message through [place] for any questions or concerns.   Side effects and appropriate use of all the  medication(s) were discussed with the patient today. Patient advised to use the medication(s) as directed by their healthcare provider. The patient was encouraged to read, review, and understand all associated package inserts and contact our office with any questions or concerns. The patient accepts the risks of the treatment plan and had an opportunity to ask questions.   Staying healthy and adopting a healthy lifestyle for your overall health is important. You should eat 7 or more servings of fruits and vegetables per day. You should drink plenty of water to keep yourself hydrated and your kidneys healthy. This includes about 65-80+ fluid ounces of water. Limit your intake of animal fats especially for elevated cholesterol. Avoid highly processed food and limit your salt intake if you have hypertension. Avoid foods high in saturated/Trans fats. Along with a healthy diet it is also very important to maintain time for yourself to maintain a healthy mental health with low stress levels. You should get atleast 150 min of moderate intensity exercise weekly for a healthy heart. Along with eating right and exercising, aim for at least 7-9 hours of sleep daily.  Eat more whole grains which includes barley, wheat berries, oats, brown rice and whole wheat pasta. Use healthy plant oils which include olive, soy, corn, sunflower and peanut. Limit your caffeine and sugary drinks. Limit your intake of fast foods. Limit milk and dairy products to one or two daily servings.   Patient was given opportunity to ask questions. Patient verbalized understanding of the plan and was able to repeat key elements of the plan. All questions were answered to their satisfaction.  Raman Marik Sedore, DNP   I, Raman Molly Maselli have reviewed all documentation for this visit. The documentation on 03/25/21 for the exam, diagnosis, procedures, and orders are all accurate and complete.    THE PATIENT IS ENCOURAGED TO PRACTICE SOCIAL DISTANCING DUE TO  THE COVID-19 PANDEMIC.

## 2021-03-25 NOTE — Patient Instructions (Signed)
Health Maintenance, Female Adopting a healthy lifestyle and getting preventive care are important in promoting health and wellness. Ask your health care provider about: The right schedule for you to have regular tests and exams. Things you can do on your own to prevent diseases and keep yourself healthy. What should I know about diet, weight, and exercise? Eat a healthy diet  Eat a diet that includes plenty of vegetables, fruits, low-fat dairy products, and lean protein. Do not eat a lot of foods that are high in solid fats, added sugars, or sodium. Maintain a healthy weight Body mass index (BMI) is used to identify weight problems. It estimates body fat based on height and weight. Your health care provider can help determine your BMI and help you achieve or maintain a healthy weight. Get regular exercise Get regular exercise. This is one of the most important things you can do for your health. Most adults should: Exercise for at least 150 minutes each week. The exercise should increase your heart rate and make you sweat (moderate-intensity exercise). Do strengthening exercises at least twice a week. This is in addition to the moderate-intensity exercise. Spend less time sitting. Even light physical activity can be beneficial. Watch cholesterol and blood lipids Have your blood tested for lipids and cholesterol at 51 years of age, then have this test every 5 years. Have your cholesterol levels checked more often if: Your lipid or cholesterol levels are high. You are older than 51 years of age. You are at high risk for heart disease. What should I know about cancer screening? Depending on your health history and family history, you may need to have cancer screening at various ages. This may include screening for: Breast cancer. Cervical cancer. Colorectal cancer. Skin cancer. Lung cancer. What should I know about heart disease, diabetes, and high blood pressure? Blood pressure and heart  disease High blood pressure causes heart disease and increases the risk of stroke. This is more likely to develop in people who have high blood pressure readings, are of African descent, or are overweight. Have your blood pressure checked: Every 3-5 years if you are 18-39 years of age. Every year if you are 40 years old or older. Diabetes Have regular diabetes screenings. This checks your fasting blood sugar level. Have the screening done: Once every three years after age 40 if you are at a normal weight and have a low risk for diabetes. More often and at a younger age if you are overweight or have a high risk for diabetes. What should I know about preventing infection? Hepatitis B If you have a higher risk for hepatitis B, you should be screened for this virus. Talk with your health care provider to find out if you are at risk for hepatitis B infection. Hepatitis C Testing is recommended for: Everyone born from 1945 through 1965. Anyone with known risk factors for hepatitis C. Sexually transmitted infections (STIs) Get screened for STIs, including gonorrhea and chlamydia, if: You are sexually active and are younger than 51 years of age. You are older than 51 years of age and your health care provider tells you that you are at risk for this type of infection. Your sexual activity has changed since you were last screened, and you are at increased risk for chlamydia or gonorrhea. Ask your health care provider if you are at risk. Ask your health care provider about whether you are at high risk for HIV. Your health care provider may recommend a prescription medicine   to help prevent HIV infection. If you choose to take medicine to prevent HIV, you should first get tested for HIV. You should then be tested every 3 months for as long as you are taking the medicine. Pregnancy If you are about to stop having your period (premenopausal) and you may become pregnant, seek counseling before you get  pregnant. Take 400 to 800 micrograms (mcg) of folic acid every day if you become pregnant. Ask for birth control (contraception) if you want to prevent pregnancy. Osteoporosis and menopause Osteoporosis is a disease in which the bones lose minerals and strength with aging. This can result in bone fractures. If you are 65 years old or older, or if you are at risk for osteoporosis and fractures, ask your health care provider if you should: Be screened for bone loss. Take a calcium or vitamin D supplement to lower your risk of fractures. Be given hormone replacement therapy (HRT) to treat symptoms of menopause. Follow these instructions at home: Lifestyle Do not use any products that contain nicotine or tobacco, such as cigarettes, e-cigarettes, and chewing tobacco. If you need help quitting, ask your health care provider. Do not use street drugs. Do not share needles. Ask your health care provider for help if you need support or information about quitting drugs. Alcohol use Do not drink alcohol if: Your health care provider tells you not to drink. You are pregnant, may be pregnant, or are planning to become pregnant. If you drink alcohol: Limit how much you use to 0-1 drink a day. Limit intake if you are breastfeeding. Be aware of how much alcohol is in your drink. In the [place] one drink equals one 12 oz bottle of beer (355 mL), one 5 oz glass of wine (148 mL), or one 1 oz glass of hard liquor (44 mL). General instructions Schedule regular health, dental, and eye exams. Stay current with your vaccines. Tell your health care provider if: You often feel depressed. You have ever been abused or do not feel safe at home. Summary Adopting a healthy lifestyle and getting preventive care are important in promoting health and wellness. Follow your health care provider's instructions about healthy diet, exercising, and getting tested or screened for diseases. Follow your health care provider's  instructions on monitoring your cholesterol and blood pressure. This information is not intended to replace advice given to you by your health care provider. Make sure you discuss any questions you have with your health care provider. Document Revised: 09/19/2020 Document Reviewed: 07/05/2018 Elsevier Patient Education  2022 [employer] Inc.  

## 2021-03-26 LAB — LIPID PANEL
Chol/HDL Ratio: 2.8 ratio (ref 0.0–4.4)
Cholesterol, Total: 118 mg/dL (ref 100–199)
HDL: 42 mg/dL (ref >39)
LDL Chol Calc (NIH): 61 mg/dL (ref 0–99)
Triglycerides: 72 mg/dL (ref 0–149)
VLDL Cholesterol Cal: 15 mg/dL (ref 5–40)

## 2021-03-26 LAB — CMP14+EGFR
ALT: 16 IU/L (ref 0–32)
AST: 17 IU/L (ref 0–40)
Albumin/Globulin Ratio: 1.4 (ref 1.2–2.2)
Albumin: 4.4 g/dL (ref 3.8–4.8)
Alkaline Phosphatase: 80 IU/L (ref 44–121)
BUN/Creatinine Ratio: 11 (ref 9–23)
BUN: 9 mg/dL (ref 6–24)
Bilirubin Total: 0.4 mg/dL (ref 0.0–1.2)
CO2: 26 mmol/L (ref 20–29)
Calcium: 9.8 mg/dL (ref 8.7–10.2)
Chloride: 101 mmol/L (ref 96–106)
Creatinine, Ser: 0.79 mg/dL (ref 0.57–1.00)
Globulin, Total: 3.1 g/dL (ref 1.5–4.5)
Glucose: 93 mg/dL (ref 65–99)
Potassium: 5.2 mmol/L (ref 3.5–5.2)
Sodium: 138 mmol/L (ref 134–144)
Total Protein: 7.5 g/dL (ref 6.0–8.5)
eGFR: 91 mL/min/{1.73_m2} (ref >59)

## 2021-03-26 LAB — CBC
Hematocrit: 39.7 % (ref 34.0–46.6)
Hemoglobin: 13 g/dL (ref 11.1–15.9)
MCH: 29.3 pg (ref 26.6–33.0)
MCHC: 32.7 g/dL (ref 31.5–35.7)
MCV: 89 fL (ref 79–97)
Platelets: 326 10*3/uL (ref 150–450)
RBC: 4.44 x10E6/uL (ref 3.77–5.28)
RDW: 12.7 % (ref 11.7–15.4)
WBC: 6.2 10*3/uL (ref 3.4–10.8)

## 2021-03-26 LAB — HEMOGLOBIN A1C
Est. average glucose Bld gHb Est-mCnc: 123 mg/dL
Hgb A1c MFr Bld: 5.9 % — ABNORMAL HIGH (ref 4.8–5.6)

## 2021-04-17 ENCOUNTER — Other Ambulatory Visit: Payer: Self-pay | Admitting: Internal Medicine

## 2021-05-04 ENCOUNTER — Other Ambulatory Visit: Payer: Self-pay | Admitting: Internal Medicine

## 2021-06-03 ENCOUNTER — Other Ambulatory Visit: Payer: Self-pay | Admitting: Obstetrics and Gynecology

## 2021-06-03 DIAGNOSIS — R928 Other abnormal and inconclusive findings on diagnostic imaging of breast: Secondary | ICD-10-CM

## 2021-06-27 ENCOUNTER — Other Ambulatory Visit: Payer: Self-pay | Admitting: Internal Medicine

## 2021-06-29 ENCOUNTER — Ambulatory Visit
Admission: RE | Admit: 2021-06-29 | Discharge: 2021-06-29 | Disposition: A | Discharge status: Home / Self Care | Payer: 59 | Source: Ambulatory Visit | Attending: Obstetrics and Gynecology | Admitting: Obstetrics and Gynecology

## 2021-06-29 DIAGNOSIS — R928 Other abnormal and inconclusive findings on diagnostic imaging of breast: Secondary | ICD-10-CM

## 2021-07-27 ENCOUNTER — Other Ambulatory Visit: Payer: Self-pay | Admitting: Internal Medicine

## 2021-07-29 ENCOUNTER — Other Ambulatory Visit: Payer: Self-pay | Admitting: Nurse Practitioner

## 2021-07-29 MED ORDER — CYCLOBENZAPRINE HCL 5 MG PO TABS: ORAL_TABLET | ORAL | 0 refills | Status: DC

## 2021-07-29 NOTE — Telephone Encounter (Signed)
Sent refill for flexeril. Follow up in 1 month

## 2021-09-22 ENCOUNTER — Encounter: Payer: Self-pay | Admitting: Internal Medicine

## 2021-09-22 ENCOUNTER — Other Ambulatory Visit: Payer: Self-pay

## 2021-09-22 ENCOUNTER — Ambulatory Visit (INDEPENDENT_AMBULATORY_CARE_PROVIDER_SITE_OTHER): Payer: 59 | Admitting: Internal Medicine

## 2021-09-22 VITALS — BP 122/84 | HR 83 | Temp 98.1°F | Ht 66.8 in | Wt 189.8 lb

## 2021-09-22 DIAGNOSIS — Z79899 Other long term (current) drug therapy: Secondary | ICD-10-CM

## 2021-09-22 DIAGNOSIS — R7303 Prediabetes: Secondary | ICD-10-CM

## 2021-09-22 DIAGNOSIS — Z2821 Immunization not carried out because of patient refusal: Secondary | ICD-10-CM | POA: Diagnosis not present

## 2021-09-22 DIAGNOSIS — M5412 Radiculopathy, cervical region: Secondary | ICD-10-CM | POA: Diagnosis not present

## 2021-09-22 DIAGNOSIS — Z6829 Body mass index (BMI) 29.0-29.9, adult: Secondary | ICD-10-CM | POA: Diagnosis not present

## 2021-09-22 MED ORDER — GABAPENTIN 300 MG PO CAPS: ORAL_CAPSULE | ORAL | 2 refills | Status: DC

## 2021-09-22 MED ORDER — CYCLOBENZAPRINE HCL 5 MG PO TABS: ORAL_TABLET | ORAL | 0 refills | Status: DC

## 2021-09-22 NOTE — Progress Notes (Signed)
Rich Brave [place],acting as a Education administrator for Maximino Greenland, MD.,have documented all relevant documentation on the behalf of Maximino Greenland, MD,as directed by  Maximino Greenland, MD while in the presence of Maximino Greenland, MD.  This visit occurred during the SARS-CoV-2 public health emergency.  Safety protocols were in place, including screening questions prior to the visit, additional usage of staff PPE, and extensive cleaning of exam room while observing appropriate contact time as indicated for disinfecting solutions.  Subjective:     Patient ID: Rachel Zamora , female    DOB: April 08, 1971 , 51 y.o.   MRN: [id number]   Chief Complaint  Patient presents with   Neck Pain   Prediabetes    HPI  The patient is here today for f/u neck pain. This is a chronic condition. She has undergone anterior cervical decompression/discectomy fusion 4 levels in 2019. She still has flares on occasion. Patient is currently taking gabapentin and that is working well for her. She also is here for a prediabetes f/u. Patient did not want to start the metformin she is trying diet modifications and exercise.   Neck Pain  This is a chronic problem. The current episode started more than 1 year ago. The problem has been gradually improving. The pain is moderate. Pertinent negatives include no fever, headaches or syncope.    Past Medical History:  Diagnosis Date   Abnormal Pap smear    AMA (advanced maternal age) multigravida 35+    Anemia    Cervical radiculopathy    right- sided   Gestational diabetes    diet controlled   Grand multiparity in labor and delivery, antepartum    Headache    History of hiatal hernia    HPV (human papilloma virus) anogenital infection      Family History  Problem Relation Age of Onset   Diabetes Father    Hypertension Father    Diabetes Mother    Breast cancer Neg Hx      Current Outpatient Medications:    esomeprazole (NEXIUM) 40 MG capsule, 40 mg as needed., Disp: ,  Rfl:    ferrous sulfate 325 (65 FE) MG tablet, Take 325 mg by mouth once a week., Disp: , Rfl:    Multiple Vitamin (MULTIVITAMIN WITH MINERALS) TABS tablet, Take 1 tablet by mouth at bedtime., Disp: , Rfl:    traMADol (ULTRAM) 50 MG tablet, Take 1 tablet (50 mg total) by mouth every 12 (twelve) hours as needed., Disp: 30 tablet, Rfl: 0   cyclobenzaprine (FLEXERIL) 5 MG tablet, Take once daily as needed for muscle spasms., Disp: 30 tablet, Rfl: 0   gabapentin (NEURONTIN) 300 MG capsule, One capsule po qpm, Disp: 90 capsule, Rfl: 2   No Known Allergies   Review of Systems  Constitutional: Negative.  Negative for fever.  Respiratory: Negative.    Cardiovascular: Negative.  Negative for syncope.  Gastrointestinal: Negative.   Musculoskeletal:  Positive for neck pain.  Neurological: Negative.  Negative for headaches.  Psychiatric/Behavioral: Negative.      Today's Vitals   09/22/21 1047  BP: 122/84  Pulse: 83  Temp: 98.1 F (36.7 C)  Weight: 189 lb 12.8 oz (86.1 kg)  Height: 5' 6.8" (1.697 m)  PainSc: 2   PainLoc: Neck   Body mass index is 29.91 kg/m.  Wt Readings from Last 3 Encounters:  09/22/21 189 lb 12.8 oz (86.1 kg)  03/25/21 181 lb 12.8 oz (82.5 kg)  07/01/20 183 lb (83 kg)  Objective:  Physical Exam Vitals and nursing note reviewed.  Constitutional:      Appearance: Normal appearance.  HENT:     Head: Normocephalic and atraumatic.     Nose:     Comments: Masked     Mouth/Throat:     Comments: Masked  Eyes:     Extraocular Movements: Extraocular movements intact.  Cardiovascular:     Rate and Rhythm: Normal rate and regular rhythm.     Heart sounds: Normal heart sounds.  Pulmonary:     Effort: Pulmonary effort is normal.     Breath sounds: Normal breath sounds.  Musculoskeletal:     Cervical back: Normal range of motion.  Skin:    General: Skin is warm.  Neurological:     General: No focal deficit present.     Mental Status: She is alert.   Psychiatric:        Mood and Affect: Mood normal.        Behavior: Behavior normal.      Assessment And Plan:     1. Cervical radiculopathy Comments: She was given refill on gabapentin to take nightly. She will rto in six months for a full physical examination.   2. Prediabetes Comments: Her a1c has been elevated in the past. I will recheck this today. She is encouraged to limit her intake of sweetened beverages, including diet drinks.  - Hemoglobin A1c - BMP8+eGFR  3. BMI 29.0-29.9,adult Comments: She is encouraged to aim for at least 150 minutes of exercise per week.   4. Herpes zoster vaccination declined  5. Influenza vaccination declined  6. Drug therapy - Liver Profile   Patient was given opportunity to ask questions. Patient verbalized understanding of the plan and was able to repeat key elements of the plan. All questions were answered to their satisfaction.   I, Maximino Greenland, MD, have reviewed all documentation for this visit. The documentation on 09/22/21 for the exam, diagnosis, procedures, and orders are all accurate and complete.   IF YOU HAVE BEEN REFERRED TO A SPECIALIST, IT MAY TAKE 1-2 WEEKS TO SCHEDULE/PROCESS THE REFERRAL. IF YOU HAVE NOT HEARD FROM US/SPECIALIST IN TWO WEEKS, PLEASE GIVE [place] A CALL AT [phone] X [phone].   THE PATIENT IS ENCOURAGED TO PRACTICE SOCIAL DISTANCING DUE TO THE COVID-19 PANDEMIC.

## 2021-09-22 NOTE — Patient Instructions (Signed)
Magnesium glycinate, Pure Encapsulations, one capsule nightly  Cervical Radiculopathy Cervical radiculopathy means that a nerve in the neck (a cervical nerve) is pinched or bruised. This can happen because of an injury to the cervical spine (vertebrae) in the neck, or as a normal part of getting older. This condition can cause pain or loss of feeling (numbness) that runs from your neck all the way down to your arm and fingers. Often, this condition gets better with rest. Treatment may be needed if the condition does not get better. What are the causes? A neck injury. A bulging disk in your spine. Sudden muscle tightening (muscle spasms). Tight muscles in your neck due to overuse. Arthritis. Breakdown in the bones and joints of the spine (spondylosis) due to getting older. Bone spurs that form near the nerves in the neck. What are the signs or symptoms? Pain. The pain may: Run from the neck to the arm and hand. Be very bad or irritating. Get worse when you move your neck. Loss of feeling or tingling in your arm or hand. Weakness in your arm or hand, in very bad cases. How is this treated? In many cases, treatment is not needed for this condition. With rest, the condition often gets better over time. If treatment is needed, options may include: Wearing a soft neck collar (cervical collar) for short periods of time. Doing exercises (physical therapy) to strengthen your neck muscles. Taking medicines. Having shots (injections) in your spine, in very bad cases. Having surgery. This may be needed if other treatments do not help. The type of surgery that is used will depend on the cause of your condition. Follow these instructions at home: If you have a soft neck collar: Wear it as told by your doctor. Take it off only as told by your doctor. Ask your doctor if you can take the collar off for cleaning and bathing. If you are allowed to take the collar off for cleaning or bathing: Follow  instructions from your doctor about how to take off the collar safely. Clean the collar by wiping it with mild soap and water and drying it completely. Take out any removable pads in the collar every 1-2 days. Wash them by hand with soap and water. Let them air-dry completely before you put them back in the collar. Check your skin under the collar for redness or sores. If you see any, tell your doctor. Managing pain   Take over-the-counter and prescription medicines only as told by your doctor. If told, put ice on the painful area. To do this: If you have a soft neck collar, take if off as told by your doctor. Put ice in a plastic bag. Place a towel between your skin and the bag. Leave the ice on for 20 minutes, 2-3 times a day. Take off the ice if your skin turns bright red. This is very important. If you cannot feel pain, heat, or cold, you have a greater risk of damage to the area. If using ice does not help, you can try using heat. Use the heat source that your doctor recommends, such as a moist heat pack or a heating pad. Place a towel between your skin and the heat source. Leave the heat on for 20-30 minutes. Take off the heat if your skin turns bright red. This is very important. If you cannot feel pain, heat, or cold, you have a greater risk of getting burned. You may try a gentle neck and shoulder rub (massage).  Activity Rest as needed. Return to your normal activities when your doctor says that it is safe. Do exercises as told by your doctor or physical therapist. You may have to avoid lifting. Ask your doctor how much you can safely lift. General instructions Use a flat pillow when you sleep. Do not drive while wearing a soft neck collar. If you do not have a soft neck collar, ask your doctor if it is safe to drive while your neck heals. Ask your doctor if you should avoid driving or using machines while you are taking your medicine. Do not smoke or use any products that contain  nicotine or tobacco. If you need help quitting, ask your doctor. Keep all follow-up visits. Contact a doctor if: Your condition does not get better with treatment. Get help right away if: Your pain gets worse and medicine does not help. You lose feeling or feel weak in your hand, arm, face, or leg. You have a high fever. Your neck is stiff. You cannot control when you poop or pee (have incontinence). You have trouble with walking, balance, or talking. Summary Cervical radiculopathy means that a nerve in the neck is pinched or bruised. A nerve can get pinched from a bulging disk, arthritis, an injury to the neck, or other causes. Symptoms include pain, tingling, or loss of feeling that goes from the neck to the arm or hand. Weakness in your arm or hand can happen in very bad cases. Treatment may include resting, wearing a soft neck collar, and doing exercises. You might need to take medicines for pain. In very bad cases, shots or surgery may be needed. This information is not intended to replace advice given to you by your health care provider. Make sure you discuss any questions you have with your health care provider. Document Revised: 01/15/2021 Document Reviewed: 01/15/2021 Elsevier Patient Education  2022 [employer].

## 2021-09-23 LAB — HEMOGLOBIN A1C
Est. average glucose Bld gHb Est-mCnc: 117 mg/dL
Hgb A1c MFr Bld: 5.7 % — ABNORMAL HIGH (ref 4.8–5.6)

## 2021-09-23 LAB — HEPATIC FUNCTION PANEL
ALT: 14 IU/L (ref 0–32)
AST: 18 IU/L (ref 0–40)
Albumin: 4.5 g/dL (ref 3.8–4.9)
Alkaline Phosphatase: 80 IU/L (ref 44–121)
Bilirubin Total: 0.4 mg/dL (ref 0.0–1.2)
Bilirubin, Direct: 0.1 mg/dL (ref 0.00–0.40)
Total Protein: 7.6 g/dL (ref 6.0–8.5)

## 2021-09-23 LAB — BMP8+EGFR
BUN/Creatinine Ratio: 7 — ABNORMAL LOW (ref 9–23)
BUN: 5 mg/dL — ABNORMAL LOW (ref 6–24)
CO2: 22 mmol/L (ref 20–29)
Calcium: 9.4 mg/dL (ref 8.7–10.2)
Chloride: 102 mmol/L (ref 96–106)
Creatinine, Ser: 0.69 mg/dL (ref 0.57–1.00)
Glucose: 88 mg/dL (ref 70–99)
Potassium: 5 mmol/L (ref 3.5–5.2)
Sodium: 136 mmol/L (ref 134–144)
eGFR: 105 mL/min/{1.73_m2} (ref >59)

## 2021-11-09 ENCOUNTER — Other Ambulatory Visit: Payer: Self-pay | Admitting: Internal Medicine

## 2022-01-12 ENCOUNTER — Other Ambulatory Visit: Payer: Self-pay | Admitting: Internal Medicine

## 2022-03-17 ENCOUNTER — Other Ambulatory Visit: Payer: Self-pay | Admitting: Internal Medicine

## 2022-04-06 ENCOUNTER — Encounter: Payer: 59 | Admitting: Internal Medicine

## 2022-05-20 ENCOUNTER — Encounter: Payer: 59 | Admitting: Internal Medicine

## 2022-10-01 ENCOUNTER — Encounter: Payer: Self-pay | Admitting: Internal Medicine

## 2022-10-01 ENCOUNTER — Ambulatory Visit (INDEPENDENT_AMBULATORY_CARE_PROVIDER_SITE_OTHER): Payer: 59 | Admitting: Internal Medicine

## 2022-10-01 VITALS — BP 118/82 | HR 75 | Temp 97.9°F | Ht 66.0 in | Wt 190.4 lb

## 2022-10-01 DIAGNOSIS — R002 Palpitations: Secondary | ICD-10-CM | POA: Diagnosis not present

## 2022-10-01 DIAGNOSIS — R7303 Prediabetes: Secondary | ICD-10-CM

## 2022-10-01 DIAGNOSIS — E6609 Other obesity due to excess calories: Secondary | ICD-10-CM | POA: Diagnosis not present

## 2022-10-01 DIAGNOSIS — Z0000 Encounter for general adult medical examination without abnormal findings: Secondary | ICD-10-CM | POA: Diagnosis not present

## 2022-10-01 DIAGNOSIS — Z23 Encounter for immunization: Secondary | ICD-10-CM

## 2022-10-01 DIAGNOSIS — Z6830 Body mass index (BMI) 30.0-30.9, adult: Secondary | ICD-10-CM

## 2022-10-01 MED ORDER — CYCLOBENZAPRINE HCL 5 MG PO TABS: ORAL_TABLET | ORAL | 0 refills | Status: DC

## 2022-10-01 MED ORDER — GABAPENTIN 300 MG PO CAPS
ORAL_CAPSULE | ORAL | 0 refills | Status: DC
Start: 2022-10-01 — End: 2023-10-12

## 2022-10-01 NOTE — Patient Instructions (Signed)

## 2022-10-01 NOTE — Progress Notes (Unsigned)
I,Rachel Zamora,acting as a scribe for Rachel Greenland, MD.,have documented all relevant documentation on the behalf of Rachel Greenland, MD,as directed by  Rachel Greenland, MD while in the presence of Rachel Greenland, MD.   Subjective:     Patient ID: Rachel Zamora , female    DOB: 04-12-1971 , 52 y.o.   MRN: [id number]   Chief Complaint  Patient presents with   Annual Exam    HPI  Patient presents today for HM, patient reports compliance with medications and has no other concerns at this time.  GYN: Rachel Zamora. Letter sent to request recent pap.      Past Medical History:  Diagnosis Date   Abnormal Pap smear    AMA (advanced maternal age) multigravida 35+    Anemia    Cervical radiculopathy    right- sided   Gestational diabetes    diet controlled   Grand multiparity in labor and delivery, antepartum    Headache    History of hiatal hernia    HPV (human papilloma virus) anogenital infection      Family History  Problem Relation Age of Onset   Diabetes Father    Hypertension Father    Diabetes Mother    Breast cancer Neg Hx      Current Outpatient Medications:    esomeprazole (NEXIUM) 40 MG capsule, 40 mg as needed., Disp: , Rfl:    cyclobenzaprine (FLEXERIL) 5 MG tablet, Take one tab po qd prn, Disp: 30 tablet, Rfl: 0   gabapentin (NEURONTIN) 300 MG capsule, One capsule po qpm, Disp: 90 capsule, Rfl: 0   Multiple Vitamin (MULTIVITAMIN WITH MINERALS) TABS tablet, Take 1 tablet by mouth at bedtime., Disp: , Rfl:    No Known Allergies    The patient states she uses none for birth control. Last LMP was Patient's last menstrual period was 09/04/2022 (exact date).. Negative for Dysmenorrhea. Negative for: breast discharge, breast lump(s), breast pain and breast self exam. Associated symptoms include abnormal vaginal bleeding. Pertinent negatives include abnormal bleeding (hematology), anxiety, decreased libido, depression, difficulty falling sleep,  dyspareunia, history of infertility, nocturia, sexual dysfunction, sleep disturbances, urinary incontinence, urinary urgency, vaginal discharge and vaginal itching. Diet regular.The patient states her exercise level is  intermittent.  . The patient's tobacco use is:  Social History   Tobacco Use  Smoking Status Never  Smokeless Tobacco Never  . She has been exposed to passive smoke. The patient's alcohol use is:  Social History   Substance and Sexual Activity  Alcohol Use No    Review of Systems  Constitutional: Negative.   HENT: Negative.    Eyes: Negative.   Respiratory: Negative.    Cardiovascular:  Positive for palpitations.       She later mentioned having some palpitations. Unable to state what triggers her sx. No associated cp, sob, diaphoresis. Has occurred at rest.  Gastrointestinal: Negative.   Endocrine: Negative.   Genitourinary: Negative.   Musculoskeletal: Negative.   Skin: Negative.   Allergic/Immunologic: Negative.   Neurological: Negative.   Hematological: Negative.   Psychiatric/Behavioral: Negative.       Today's Vitals   10/01/22 1453  BP: 118/82  Pulse: 75  Temp: 97.9 F (36.6 C)  SpO2: 98%  Weight: 190 lb 6.4 oz (86.4 kg)  Height: '5\' 6"'$  (1.676 m)   Body mass index is 30.73 kg/m.  Wt Readings from Last 3 Encounters:  10/01/22 190 lb 6.4 oz (86.4 kg)  09/22/21 189 lb  12.8 oz (86.1 kg)  03/25/21 181 lb 12.8 oz (82.5 kg)    Objective:  Physical Exam Vitals and nursing note reviewed.  Constitutional:      Appearance: Normal appearance.  HENT:     Head: Normocephalic and atraumatic.     Right Ear: Tympanic membrane, ear canal and external ear normal.     Left Ear: Tympanic membrane, ear canal and external ear normal.     Nose:     Comments: Masked     Mouth/Throat:     Comments: Masked  Eyes:     Extraocular Movements: Extraocular movements intact.     Conjunctiva/sclera: Conjunctivae normal.     Pupils: Pupils are equal, round, and  reactive to light.  Cardiovascular:     Rate and Rhythm: Normal rate and regular rhythm.     Pulses: Normal pulses.     Heart sounds: Normal heart sounds.  Pulmonary:     Effort: Pulmonary effort is normal.     Breath sounds: Normal breath sounds.  Chest:  Breasts:    Tanner Score is 5.     Right: Normal.     Left: Normal.  Abdominal:     General: Abdomen is flat. Bowel sounds are normal.     Palpations: Abdomen is soft.  Genitourinary:    Comments: deferred Musculoskeletal:        General: Normal range of motion.     Cervical back: Normal range of motion and neck supple.  Skin:    General: Skin is warm and dry.  Neurological:     General: No focal deficit present.     Mental Status: She is alert and oriented to person, place, and time.  Psychiatric:        Mood and Affect: Mood normal.        Behavior: Behavior normal.         Assessment And Plan:     1. Annual physical exam Comments: A full exam was performed. Importance of monthly self breast exams was discussed with patient. She is UTD w/ colon CA screening. Will request pap/mammo results.  PATIENT IS ADVISED TO GET 30-45 MINUTES REGULAR EXERCISE NO LESS THAN FOUR TO FIVE DAYS PER WEEK - BOTH WEIGHTBEARING EXERCISES AND AEROBIC ARE RECOMMENDED.  PATIENT IS ADVISED TO FOLLOW A HEALTHY DIET WITH AT LEAST SIX FRUITS/VEGGIES PER DAY, DECREASE INTAKE OF RED MEAT, AND TO INCREASE FISH INTAKE TO TWO DAYS PER WEEK.  MEATS/FISH SHOULD NOT BE FRIED, BAKED OR BROILED IS PREFERABLE.  IT IS ALSO IMPORTANT TO CUT BACK ON YOUR SUGAR INTAKE. PLEASE AVOID ANYTHING WITH ADDED SUGAR, CORN SYRUP OR OTHER SWEETENERS. IF YOU MUST USE A SWEETENER, YOU CAN TRY STEVIA. IT IS ALSO IMPORTANT TO AVOID ARTIFICIALLY SWEETENERS AND DIET BEVERAGES. LASTLY, I SUGGEST WEARING SPF 50 SUNSCREEN ON EXPOSED PARTS AND ESPECIALLY WHEN IN THE DIRECT SUNLIGHT FOR AN EXTENDED PERIOD OF TIME.  PLEASE AVOID FAST FOOD RESTAURANTS AND INCREASE YOUR WATER INTAKE. -  CMP14+EGFR - CBC - Hemoglobin A1c - Lipid panel - Flu Vaccine QUAD 6+ mos PF IM (Fluarix Quad PF) - TSH  2. Palpitations Comments: Intermittent. EKG performed, NSR w/o acute changes. I will check electrolytes. She may benefit from Mg supplementation and avoidance of caffeine. If persistent, will refer to Cardiology for further evaluation and possibly check echo. She was advised palpitations are also commonly associated with peri/postmenopause.  - CMP14+EGFR - CBC - TSH - Magnesium - EKG 12-Lead  3. Class 1 obesity due to  excess calories without serious comorbidity with body mass index (BMI) of 30.0 to 30.9 in adult Comments: She is encouraged to aim for at least 150 minutes of exercise/week.  4. Influenza vaccine needed - Flu Vaccine QUAD 6+ mos PF IM (Fluarix Quad PF)   Patient was given opportunity to ask questions. Patient verbalized understanding of the plan and was able to repeat key elements of the plan. All questions were answered to their satisfaction.    I, Rachel Greenland, MD, have reviewed all documentation for this visit. The documentation on 10/01/22 for the exam, diagnosis, procedures, and orders are all accurate and complete.   THE PATIENT IS ENCOURAGED TO PRACTICE SOCIAL DISTANCING DUE TO THE COVID-19 PANDEMIC.

## 2022-10-02 DIAGNOSIS — R002 Palpitations: Secondary | ICD-10-CM | POA: Insufficient documentation

## 2022-10-02 LAB — CBC
Hematocrit: 40.7 % (ref 34.0–46.6)
Hemoglobin: 13.2 g/dL (ref 11.1–15.9)
MCH: 29.3 pg (ref 26.6–33.0)
MCHC: 32.4 g/dL (ref 31.5–35.7)
MCV: 90 fL (ref 79–97)
Platelets: 343 10*3/uL (ref 150–450)
RBC: 4.51 x10E6/uL (ref 3.77–5.28)
RDW: 12.8 % (ref 11.7–15.4)
WBC: 6.4 10*3/uL (ref 3.4–10.8)

## 2022-10-02 LAB — LIPID PANEL
Chol/HDL Ratio: 3 ratio (ref 0.0–4.4)
Cholesterol, Total: 121 mg/dL (ref 100–199)
HDL: 41 mg/dL (ref >39)
LDL Chol Calc (NIH): 62 mg/dL (ref 0–99)
Triglycerides: 92 mg/dL (ref 0–149)
VLDL Cholesterol Cal: 18 mg/dL (ref 5–40)

## 2022-10-02 LAB — HEMOGLOBIN A1C
Est. average glucose Bld gHb Est-mCnc: 123 mg/dL
Hgb A1c MFr Bld: 5.9 % — ABNORMAL HIGH (ref 4.8–5.6)

## 2022-10-02 LAB — CMP14+EGFR
ALT: 13 IU/L (ref 0–32)
AST: 18 IU/L (ref 0–40)
Albumin/Globulin Ratio: 1.4 (ref 1.2–2.2)
Albumin: 4.2 g/dL (ref 3.8–4.9)
Alkaline Phosphatase: 90 IU/L (ref 44–121)
BUN/Creatinine Ratio: 14 (ref 9–23)
BUN: 11 mg/dL (ref 6–24)
Bilirubin Total: 0.2 mg/dL (ref 0.0–1.2)
CO2: 25 mmol/L (ref 20–29)
Calcium: 9.4 mg/dL (ref 8.7–10.2)
Chloride: 101 mmol/L (ref 96–106)
Creatinine, Ser: 0.76 mg/dL (ref 0.57–1.00)
Globulin, Total: 2.9 g/dL (ref 1.5–4.5)
Glucose: 85 mg/dL (ref 70–99)
Potassium: 4.4 mmol/L (ref 3.5–5.2)
Sodium: 139 mmol/L (ref 134–144)
Total Protein: 7.1 g/dL (ref 6.0–8.5)
eGFR: 94 mL/min/{1.73_m2} (ref >59)

## 2022-10-02 LAB — MAGNESIUM: Magnesium: 1.7 mg/dL (ref 1.6–2.3)

## 2022-10-02 LAB — TSH: TSH: 1.02 u[IU]/mL (ref 0.450–4.500)

## 2022-10-06 ENCOUNTER — Encounter: Payer: 59 | Admitting: Internal Medicine

## 2022-11-03 ENCOUNTER — Other Ambulatory Visit: Payer: Self-pay | Admitting: Internal Medicine

## 2022-12-27 ENCOUNTER — Other Ambulatory Visit: Payer: Self-pay | Admitting: Internal Medicine

## 2023-02-08 ENCOUNTER — Other Ambulatory Visit: Payer: Self-pay | Admitting: Internal Medicine

## 2023-10-12 ENCOUNTER — Encounter: Payer: Self-pay | Admitting: Internal Medicine

## 2023-10-12 ENCOUNTER — Ambulatory Visit: Payer: 59 | Admitting: Internal Medicine

## 2023-10-12 VITALS — BP 124/70 | HR 75 | Temp 97.5°F | Ht 66.0 in | Wt 197.4 lb

## 2023-10-12 DIAGNOSIS — M545 Low back pain, unspecified: Secondary | ICD-10-CM

## 2023-10-12 DIAGNOSIS — G8929 Other chronic pain: Secondary | ICD-10-CM

## 2023-10-12 DIAGNOSIS — Z23 Encounter for immunization: Secondary | ICD-10-CM | POA: Diagnosis not present

## 2023-10-12 DIAGNOSIS — E66811 Obesity, class 1: Secondary | ICD-10-CM | POA: Diagnosis not present

## 2023-10-12 DIAGNOSIS — Z0000 Encounter for general adult medical examination without abnormal findings: Secondary | ICD-10-CM | POA: Diagnosis not present

## 2023-10-12 DIAGNOSIS — Z6831 Body mass index (BMI) 31.0-31.9, adult: Secondary | ICD-10-CM

## 2023-10-12 DIAGNOSIS — R7309 Other abnormal glucose: Secondary | ICD-10-CM | POA: Diagnosis not present

## 2023-10-12 DIAGNOSIS — E6609 Other obesity due to excess calories: Secondary | ICD-10-CM

## 2023-10-12 MED ORDER — CYCLOBENZAPRINE HCL 5 MG PO TABS: ORAL_TABLET | ORAL | 0 refills | Status: AC

## 2023-10-12 MED ORDER — GABAPENTIN 300 MG PO CAPS: ORAL_CAPSULE | ORAL | 0 refills | Status: AC

## 2023-10-12 MED ORDER — TRAMADOL HCL 50 MG PO TABS: 50.0000 mg | ORAL_TABLET | Freq: Four times a day (QID) | ORAL | 0 refills | Status: AC | PRN

## 2023-10-12 NOTE — Patient Instructions (Signed)

## 2023-10-12 NOTE — Progress Notes (Signed)
 I,Victoria T Deloria Lair, CMA,acting as a Neurosurgeon for Gwynneth Aliment, MD.,have documented all relevant documentation on the behalf of Gwynneth Aliment, MD,as directed by  Gwynneth Aliment, MD while in the presence of Gwynneth Aliment, MD.  Subjective:    Patient ID: Rachel Zamora , female    DOB: 11-11-1970 , 53 y.o.   MRN: [id number]  Chief Complaint  Patient presents with   Annual Exam    HPI  Patient presents today for annual exam. GYN: Huel Cote. Letter sent to request recent pap.   She complains of severe back pain that has intensified recently. She was recently seen by Lala Lund in January 2025 for similar sx.      Past Medical History:  Diagnosis Date   Abnormal Pap smear    AMA (advanced maternal age) multigravida 35+    Anemia    Cervical radiculopathy    right- sided   Gestational diabetes    diet controlled   Grand multiparity in labor and delivery, antepartum    Headache    History of hiatal hernia    HPV (human papilloma virus) anogenital infection      Family History  Problem Relation Age of Onset   Diabetes Father    Hypertension Father    Diabetes Mother    Breast cancer Neg Hx      Current Outpatient Medications:    traMADol (ULTRAM) 50 MG tablet, Take 1 tablet (50 mg total) by mouth every 6 (six) hours as needed., Disp: 20 tablet, Rfl: 0   cyclobenzaprine (FLEXERIL) 5 MG tablet, TAKE 1 TABLET BY MOUTH EVERY DAY AS NEEDED, Disp: 30 tablet, Rfl: 0   esomeprazole (NEXIUM) 40 MG capsule, 40 mg as needed. (Patient not taking: Reported on 10/12/2023), Disp: , Rfl:    gabapentin (NEURONTIN) 300 MG capsule, One capsule po qpm prn back pain, Disp: 90 capsule, Rfl: 0   No Known Allergies    The patient states she uses vasectomy for birth control. Patient's last menstrual period was 09/13/2023 (exact date).. Negative for Dysmenorrhea. Negative for: breast discharge, breast lump(s), breast pain and breast self exam. Associated symptoms include abnormal  vaginal bleeding. Pertinent negatives include abnormal bleeding (hematology), anxiety, decreased libido, depression, difficulty falling sleep, dyspareunia, history of infertility, nocturia, sexual dysfunction, sleep disturbances, urinary incontinence, urinary urgency, vaginal discharge and vaginal itching. Diet regular.The patient states her exercise level is  moderate - walking.   . The patient's tobacco use is:  Social History   Tobacco Use  Smoking Status Never  Smokeless Tobacco Never  . She has been exposed to passive smoke. The patient's alcohol use is:  Social History   Substance and Sexual Activity  Alcohol Use No    Review of Systems  Constitutional: Negative.   HENT: Negative.    Eyes: Negative.   Respiratory: Negative.    Cardiovascular: Negative.   Gastrointestinal: Negative.   Endocrine: Negative.   Genitourinary: Negative.   Musculoskeletal:  Positive for back pain.  Skin: Negative.   Allergic/Immunologic: Negative.   Neurological: Negative.   Hematological: Negative.   Psychiatric/Behavioral: Negative.       Today's Vitals   10/12/23 1446  BP: 124/70  Pulse: 75  Temp: (!) 97.5 F (36.4 C)  SpO2: 98%  Weight: 197 lb 6.4 oz (89.5 kg)  Height: 5\' 6"  (1.676 m)   Body mass index is 31.86 kg/m.  Wt Readings from Last 3 Encounters:  10/12/23 197 lb 6.4 oz (89.5 kg)  10/01/22  190 lb 6.4 oz (86.4 kg)  09/22/21 189 lb 12.8 oz (86.1 kg)     Objective:  Physical Exam Vitals and nursing note reviewed.  Constitutional:      Appearance: Normal appearance.  HENT:     Head: Normocephalic and atraumatic.     Right Ear: Tympanic membrane, ear canal and external ear normal.     Left Ear: Tympanic membrane, ear canal and external ear normal.  Eyes:     Extraocular Movements: Extraocular movements intact.     Conjunctiva/sclera: Conjunctivae normal.     Pupils: Pupils are equal, round, and reactive to light.  Cardiovascular:     Rate and Rhythm: Normal rate and  regular rhythm.     Pulses: Normal pulses.     Heart sounds: Normal heart sounds.  Pulmonary:     Effort: Pulmonary effort is normal.     Breath sounds: Normal breath sounds.  Chest:  Breasts:    Tanner Score is 5.     Right: Normal.     Left: Normal.  Abdominal:     General: Abdomen is flat. Bowel sounds are normal.     Palpations: Abdomen is soft.  Genitourinary:    Comments: deferred Musculoskeletal:        General: Normal range of motion.     Cervical back: Normal range of motion and neck supple.  Skin:    General: Skin is warm and dry.  Neurological:     General: No focal deficit present.     Mental Status: She is alert and oriented to person, place, and time.  Psychiatric:        Mood and Affect: Mood normal.        Behavior: Behavior normal.         Assessment And Plan:     Annual physical exam Assessment & Plan:  A full exam was performed.  Importance of monthly self breast exams was discussed with the patient.  She is advised to get 30-45 minutes of regular exercise, no less than four to five days per week. Both weight-bearing and aerobic exercises are recommended.  She is advised to follow a healthy diet with at least six fruits/veggies per day, decrease intake of red meat and other saturated fats and to increase fish intake to twice weekly.  Meats/fish should not be fried -- baked, boiled or broiled is preferable. It is also important to cut back on your sugar intake.  Be sure to read labels - try to avoid anything with added sugar, high fructose corn syrup or other sweeteners.  If you must use a sweetener, you can try stevia or monkfruit.  It is also important to avoid artificially sweetened foods/beverages and diet drinks. Lastly, wear SPF 50 sunscreen on exposed skin and when in direct sunlight for an extended period of time.  Be sure to avoid fast food restaurants and aim for at least 60 ounces of water daily.      Orders: -     CBC -     CMP14+EGFR -     Lipid  panel  Chronic right-sided low back pain without sciatica Assessment & Plan: Chronic, she was given rx cyclobenzaprine to use nightly prn. PDMP reviewed. She was also given refill of gabapentin 300mg  po at bedtime and tramadol to take prn. She is encouraged to perform stretching exercises regularly. Will request Ortho records.   Other abnormal glucose Assessment & Plan: Previous labs reviewed, her A1c has been elevated in the past.  I will check an A1c today. Reminded to avoid refined sugars including sugary drinks/foods and processed meats including bacon, sausages and deli meats.    Orders: -     Hemoglobin A1c  Class 1 obesity due to excess calories without serious comorbidity with body mass index (BMI) of 31.0 to 31.9 in adult Assessment & Plan: She is encouraged to strive for BMI less than 30 to decrease cardiac risk. Advised to aim for at least 150 minutes of exercise per week.    Immunization due -     Flu vaccine trivalent PF, 6mos and older(Flulaval,Afluria,Fluarix,Fluzone)  Other orders -     Gabapentin; One capsule po qpm prn back pain  Dispense: 90 capsule; Refill: 0 -     Cyclobenzaprine HCl; TAKE 1 TABLET BY MOUTH EVERY DAY AS NEEDED  Dispense: 30 tablet; Refill: 0 -     traMADol HCl; Take 1 tablet (50 mg total) by mouth every 6 (six) hours as needed.  Dispense: 20 tablet; Refill: 0  She is encouraged to strive for BMI less than 30 to decrease cardiac risk. Advised to aim for at least 150 minutes of exercise per week.   Return in 4 weeks (on 11/09/2023), or [place] shingles vaccine, for 1 year HM, . Patient was given opportunity to ask questions. Patient verbalized understanding of the plan and was able to repeat key elements of the plan. All questions were answered to their satisfaction.    I, Gwynneth Aliment, MD, have reviewed all documentation for this visit. The documentation on 10/12/23 for the exam, diagnosis, procedures, and orders are all accurate and complete.

## 2023-10-13 LAB — CMP14+EGFR
ALT: 17 IU/L (ref 0–32)
AST: 18 IU/L (ref 0–40)
Albumin: 4.3 g/dL (ref 3.8–4.9)
Alkaline Phosphatase: 83 IU/L (ref 44–121)
BUN/Creatinine Ratio: 13 (ref 9–23)
BUN: 10 mg/dL (ref 6–24)
Bilirubin Total: <0.2 mg/dL (ref 0.0–1.2)
CO2: 22 mmol/L (ref 20–29)
Calcium: 9.4 mg/dL (ref 8.7–10.2)
Chloride: 102 mmol/L (ref 96–106)
Creatinine, Ser: 0.77 mg/dL (ref 0.57–1.00)
Globulin, Total: 3 g/dL (ref 1.5–4.5)
Glucose: 81 mg/dL (ref 70–99)
Potassium: 4.8 mmol/L (ref 3.5–5.2)
Sodium: 138 mmol/L (ref 134–144)
Total Protein: 7.3 g/dL (ref 6.0–8.5)
eGFR: 92 mL/min/{1.73_m2} (ref >59)

## 2023-10-13 LAB — CBC
Hematocrit: 41.5 % (ref 34.0–46.6)
Hemoglobin: 13.6 g/dL (ref 11.1–15.9)
MCH: 29.6 pg (ref 26.6–33.0)
MCHC: 32.8 g/dL (ref 31.5–35.7)
MCV: 90 fL (ref 79–97)
Platelets: 326 10*3/uL (ref 150–450)
RBC: 4.59 x10E6/uL (ref 3.77–5.28)
RDW: 13.6 % (ref 11.7–15.4)
WBC: 9.3 10*3/uL (ref 3.4–10.8)

## 2023-10-13 LAB — LIPID PANEL
Chol/HDL Ratio: 3.2 ratio (ref 0.0–4.4)
Cholesterol, Total: 124 mg/dL (ref 100–199)
HDL: 39 mg/dL — ABNORMAL LOW (ref >39)
LDL Chol Calc (NIH): 64 mg/dL (ref 0–99)
Triglycerides: 117 mg/dL (ref 0–149)
VLDL Cholesterol Cal: 21 mg/dL (ref 5–40)

## 2023-10-13 LAB — HEMOGLOBIN A1C
Est. average glucose Bld gHb Est-mCnc: 120 mg/dL
Hgb A1c MFr Bld: 5.8 % — ABNORMAL HIGH (ref 4.8–5.6)

## 2023-10-16 DIAGNOSIS — G8929 Other chronic pain: Secondary | ICD-10-CM | POA: Insufficient documentation

## 2023-10-16 DIAGNOSIS — E6609 Other obesity due to excess calories: Secondary | ICD-10-CM | POA: Insufficient documentation

## 2023-10-16 NOTE — Assessment & Plan Note (Addendum)
 Chronic, she was given rx cyclobenzaprine to use nightly prn. PDMP reviewed. She was also given refill of gabapentin 300mg  po at bedtime and tramadol to take prn. She is encouraged to perform stretching exercises regularly. Will request Ortho records.

## 2023-10-16 NOTE — Assessment & Plan Note (Signed)
 She is encouraged to strive for BMI less than 30 to decrease cardiac risk. Advised to aim for at least 150 minutes of exercise per week.

## 2023-10-16 NOTE — Assessment & Plan Note (Signed)
 Previous labs reviewed, her A1c has been elevated in the past. I will check an A1c today. Reminded to avoid refined sugars including sugary drinks/foods and processed meats including bacon, sausages and deli meats.

## 2023-10-16 NOTE — Assessment & Plan Note (Signed)

## 2023-11-08 ENCOUNTER — Ambulatory Visit (INDEPENDENT_AMBULATORY_CARE_PROVIDER_SITE_OTHER)

## 2023-11-08 VITALS — BP 120/70 | HR 85 | Temp 98.6°F | Ht 66.0 in | Wt 197.0 lb

## 2023-11-08 DIAGNOSIS — Z23 Encounter for immunization: Secondary | ICD-10-CM

## 2023-11-08 NOTE — Progress Notes (Signed)
 Patient presents today for shingles vaccine #1. They deny symptoms of recent infection, fever, and chills. They received injection in L arm.

## 2023-11-24 ENCOUNTER — Telehealth: Admitting: Physician Assistant

## 2023-11-24 DIAGNOSIS — H109 Unspecified conjunctivitis: Secondary | ICD-10-CM | POA: Diagnosis not present

## 2023-11-24 MED ORDER — POLYMYXIN B-TRIMETHOPRIM 10000-0.1 UNIT/ML-% OP SOLN: OPHTHALMIC | 0 refills | Status: DC

## 2023-11-24 NOTE — Progress Notes (Signed)
 I have spent 5 minutes in review of e-visit questionnaire, review and updating patient chart, medical decision making and response to patient.   Piedad Climes, PA-C

## 2023-11-24 NOTE — Progress Notes (Signed)

## 2023-12-02 IMAGING — US US BREAST*R* LIMITED INC AXILLA
1 series · 10 of 10 positions shown · non-contrast
Comparison: Previous exam(s).

CLINICAL DATA: 50-year-old female for further evaluation of
possible RIGHT breast mass on screening mammogram.

EXAM:
DIGITAL DIAGNOSTIC UNILATERAL RIGHT MAMMOGRAM WITH TOMOSYNTHESIS AND
CAD; ULTRASOUND RIGHT BREAST LIMITED
TECHNIQUE: Right digital diagnostic mammography and breast tomosynthesis was
performed. The images were evaluated with computer-aided detection.;
Targeted ultrasound examination of the right breast was performed

[Series 1: us breast*right* limited inc axilla · 0.07mm/px · 10 of 10 slices shown]
[im 1/10]
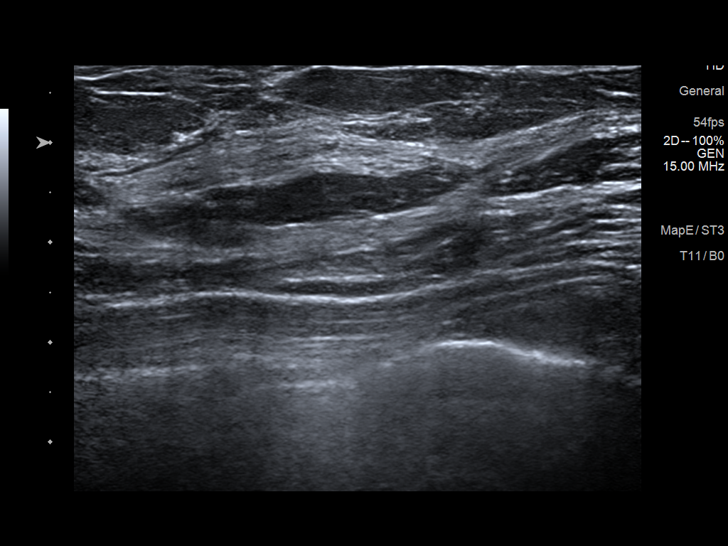
[im 2/10]
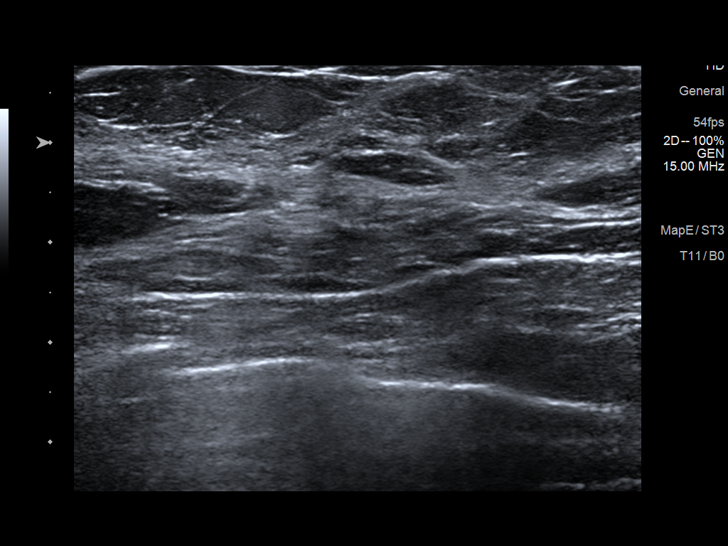
[im 3/10]
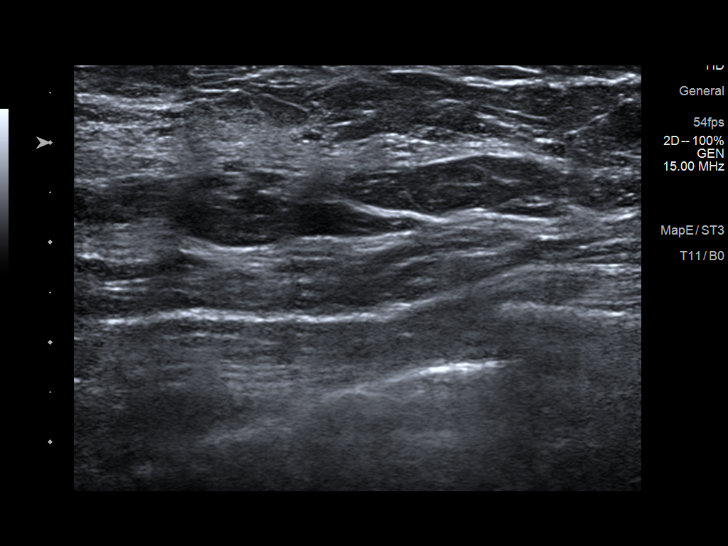
[im 4/10]
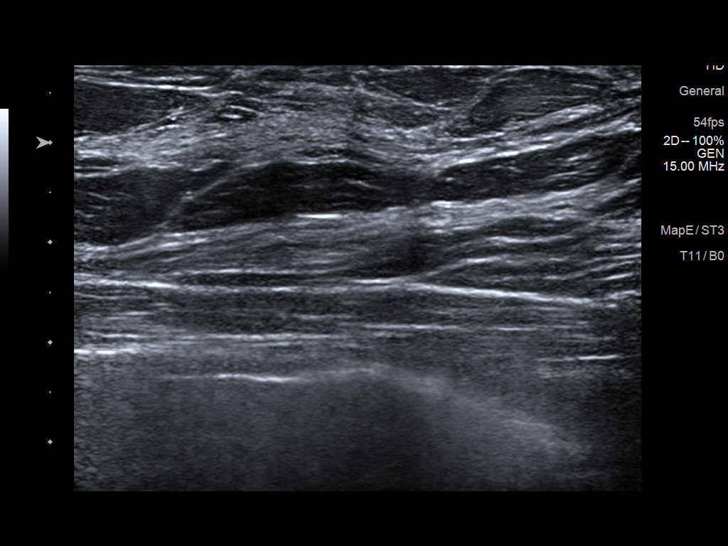
[im 5/10]
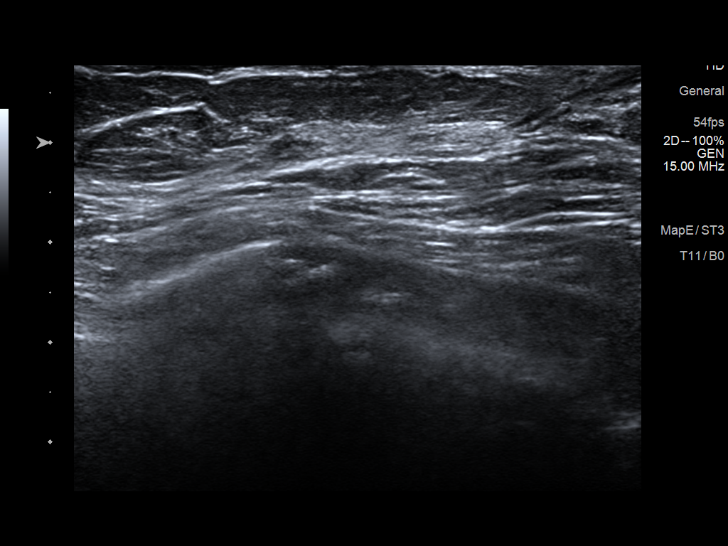
[im 6/10]
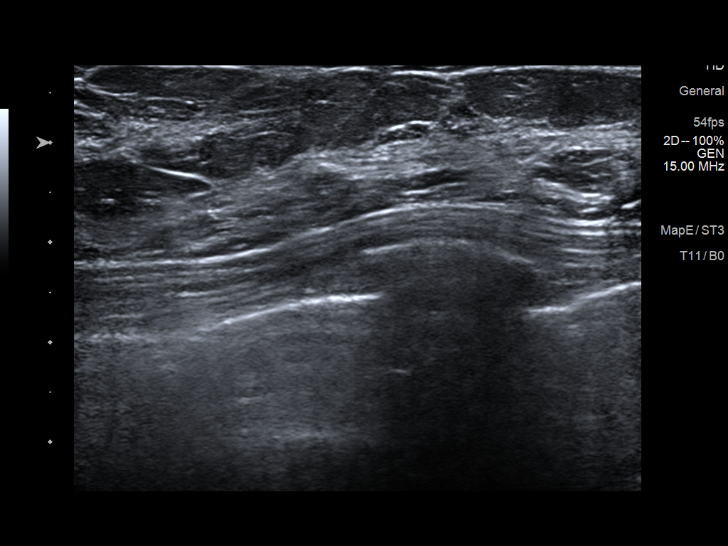
[im 7/10]
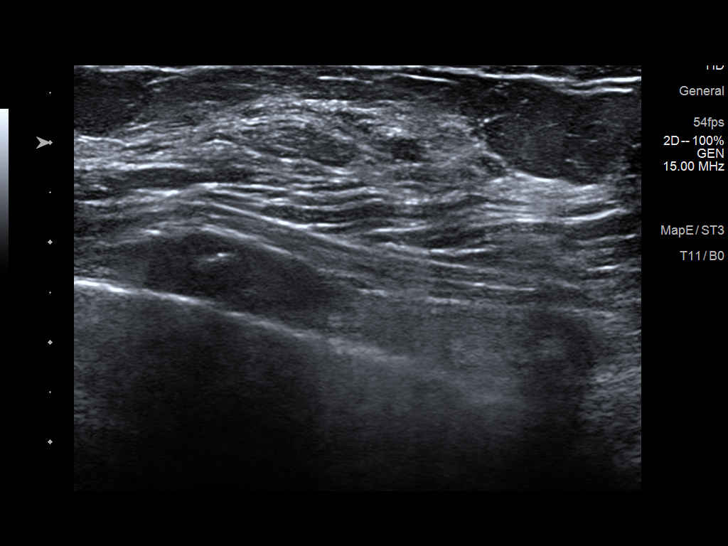
[im 8/10]
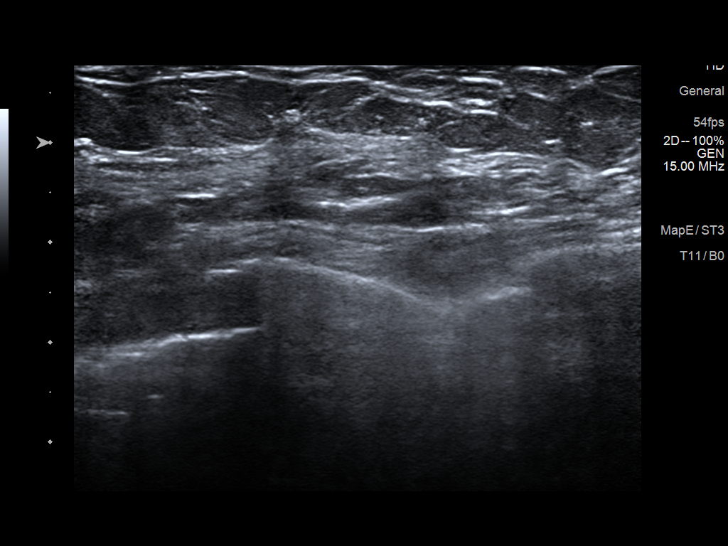
[im 9/10]
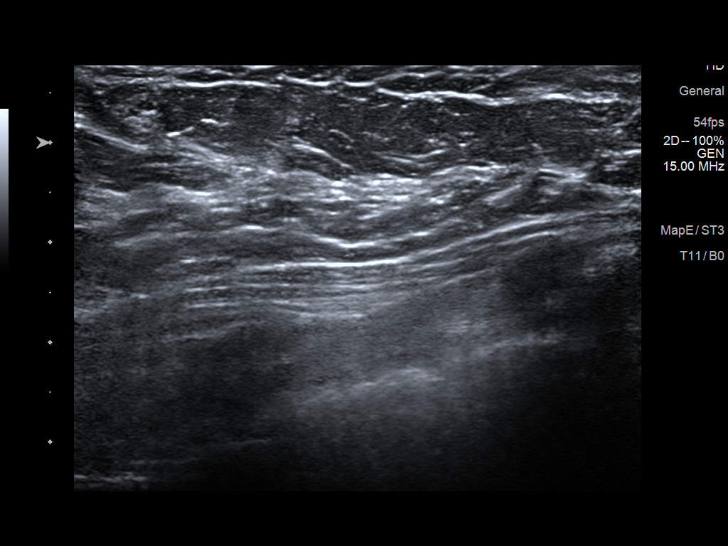
[im 10/10]
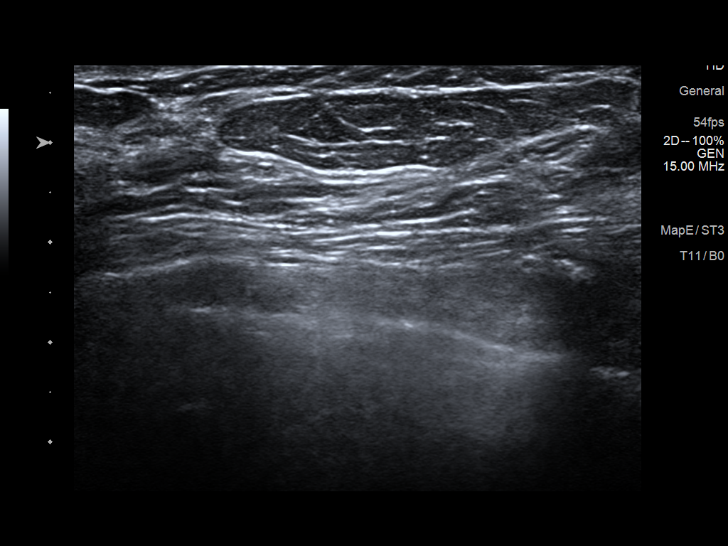

[10 of 10 positions shown; findings below may reference images not displayed]

ACR Breast Density Category b: There are scattered areas of
fibroglandular density.
FINDINGS: 2D/3D spot compression views of the RIGHT breast demonstrate no
definite persistent abnormality in the area of the RIGHT breast
screening study finding.

Targeted ultrasound is performed, showing no sonographic
abnormalities central, RETROAREOLAR or INNER RIGHT breast.
IMPRESSION: No persistent mammographic or sonographic abnormality in the area of
the screening study finding.

RECOMMENDATION:
Bilateral screening mammogram in 1 year.

I have discussed the findings and recommendations with the patient.
If applicable, a reminder letter will be sent to the patient
regarding the next appointment.

BI-RADS CATEGORY  1: Negative.

## 2023-12-02 IMAGING — MG MM DIGITAL DIAGNOSTIC UNILAT*R* W/ TOMO W/ CAD
4 series · 4 of 12 positions shown · non-contrast
Comparison: Previous exam(s).

CLINICAL DATA: 50-year-old female for further evaluation of
possible RIGHT breast mass on screening mammogram.

EXAM:
DIGITAL DIAGNOSTIC UNILATERAL RIGHT MAMMOGRAM WITH TOMOSYNTHESIS AND
CAD; ULTRASOUND RIGHT BREAST LIMITED
TECHNIQUE: Right digital diagnostic mammography and breast tomosynthesis was
performed. The images were evaluated with computer-aided detection.;
Targeted ultrasound examination of the right breast was performed

[R MLO synth-2D]
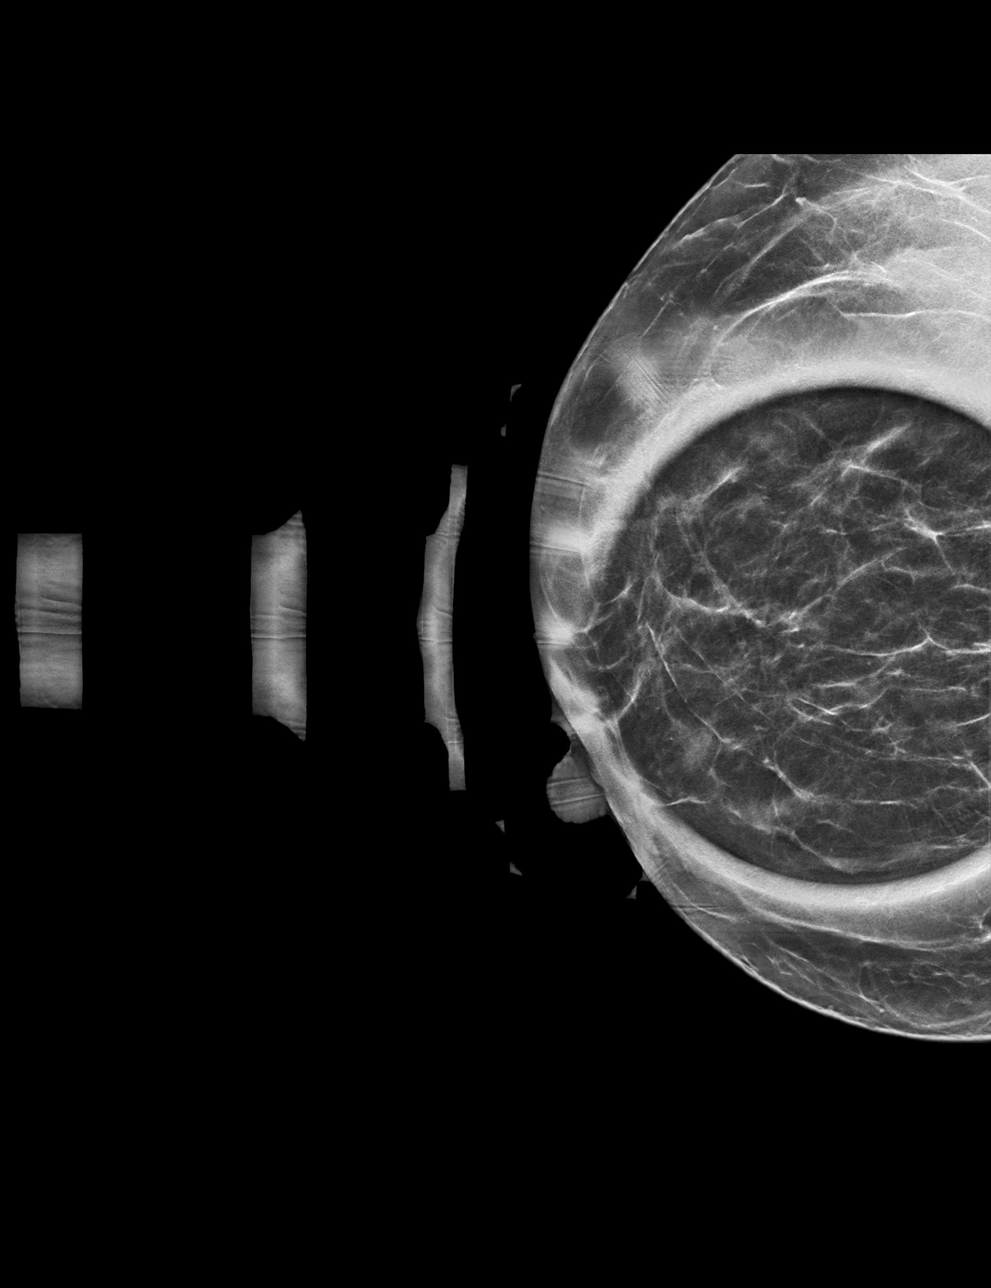

[R CC synth-2D]
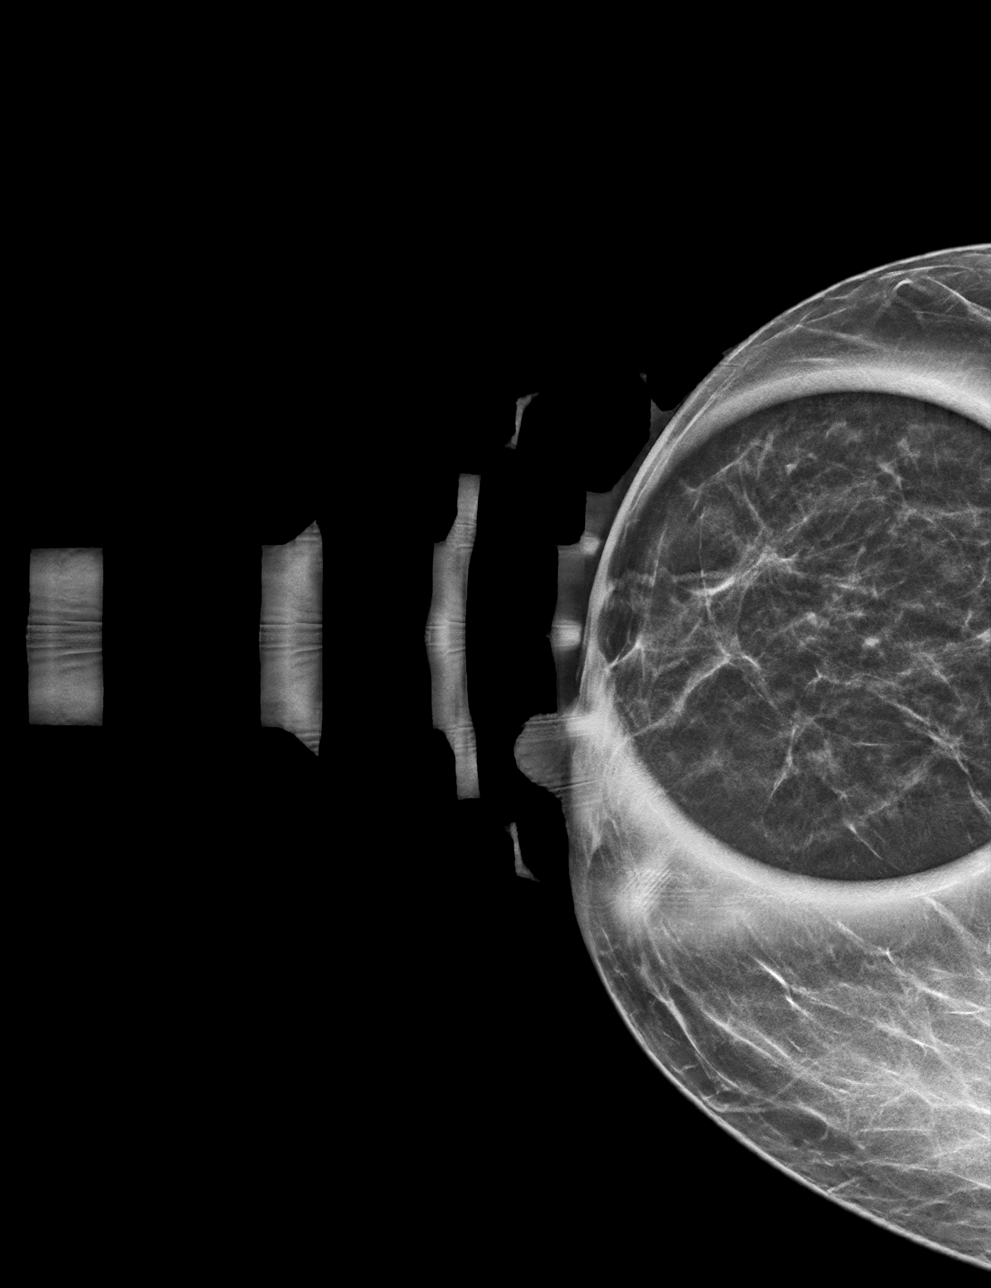

[R MLO tomo · tomo slice 25/49.0]
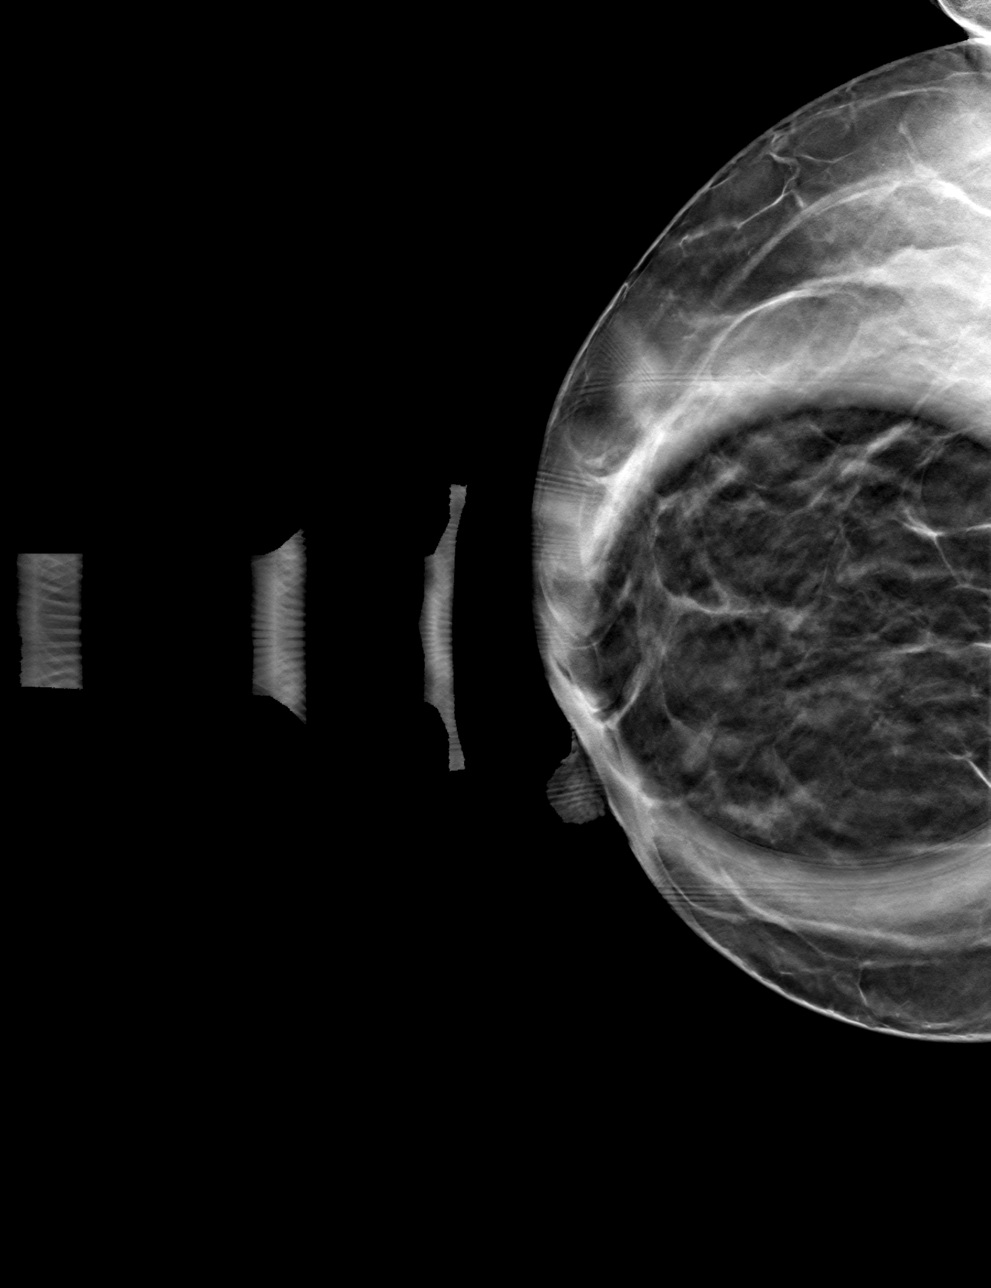

[R CC tomo · tomo slice 21/40.0]
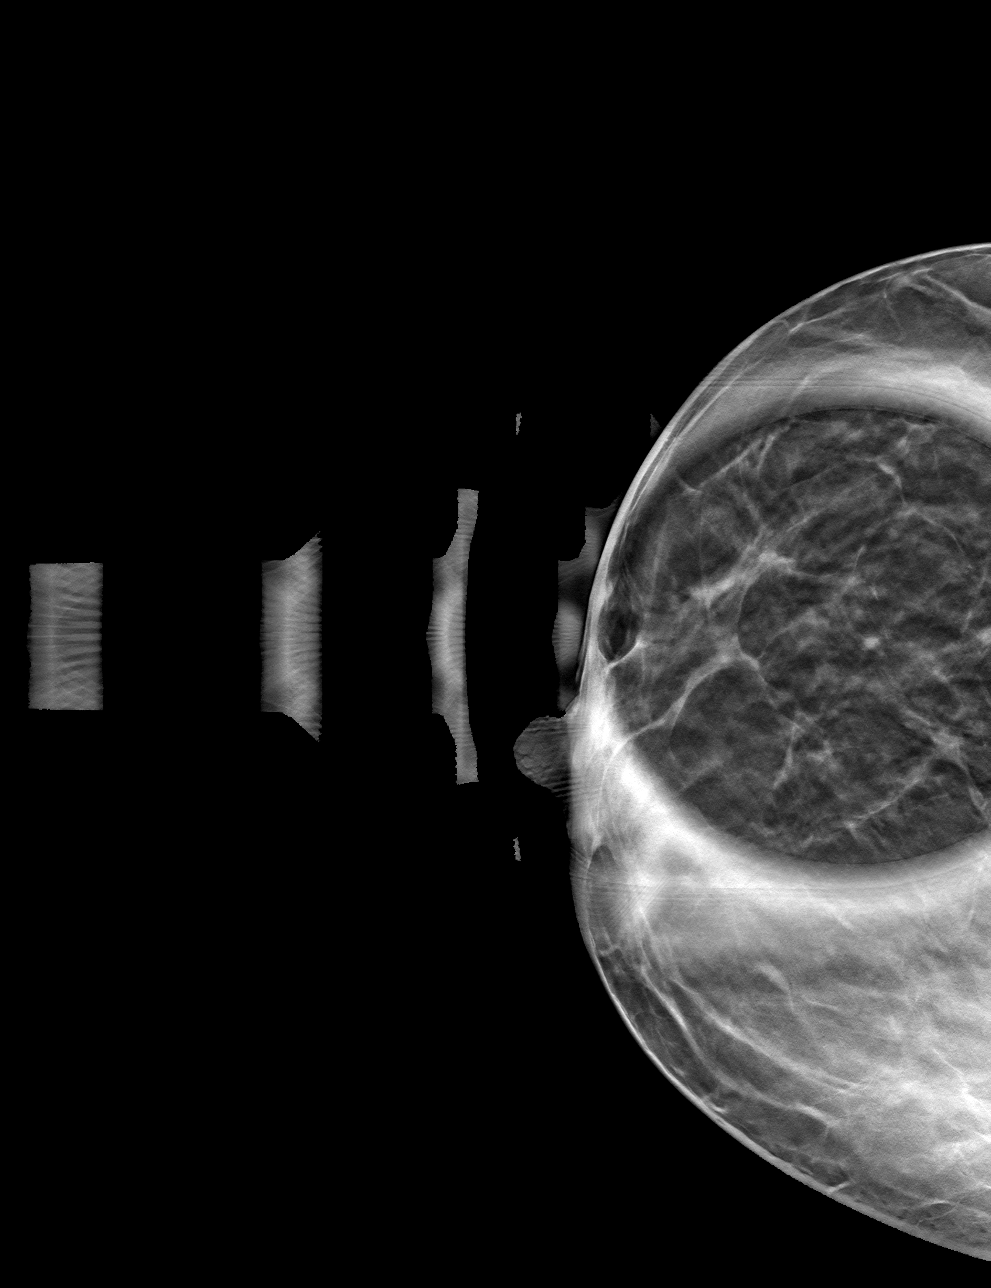

[4 of 12 positions shown; findings below may reference images not displayed]

ACR Breast Density Category b: There are scattered areas of
fibroglandular density.
FINDINGS: 2D/3D spot compression views of the RIGHT breast demonstrate no
definite persistent abnormality in the area of the RIGHT breast
screening study finding.

Targeted ultrasound is performed, showing no sonographic
abnormalities central, RETROAREOLAR or INNER RIGHT breast.
IMPRESSION: No persistent mammographic or sonographic abnormality in the area of
the screening study finding.

RECOMMENDATION:
Bilateral screening mammogram in 1 year.

I have discussed the findings and recommendations with the patient.
If applicable, a reminder letter will be sent to the patient
regarding the next appointment.

BI-RADS CATEGORY  1: Negative.

## 2024-06-19 ENCOUNTER — Ambulatory Visit: Payer: Self-pay

## 2024-06-19 ENCOUNTER — Ambulatory Visit: Admitting: Family Medicine

## 2024-06-19 ENCOUNTER — Encounter: Payer: Self-pay | Admitting: Family Medicine

## 2024-06-19 VITALS — BP 120/70 | HR 80 | Temp 101.0°F | Ht 66.0 in | Wt 201.4 lb

## 2024-06-19 DIAGNOSIS — J029 Acute pharyngitis, unspecified: Secondary | ICD-10-CM

## 2024-06-19 DIAGNOSIS — R051 Acute cough: Secondary | ICD-10-CM | POA: Diagnosis not present

## 2024-06-19 DIAGNOSIS — R509 Fever, unspecified: Secondary | ICD-10-CM | POA: Diagnosis not present

## 2024-06-19 DIAGNOSIS — J101 Influenza due to other identified influenza virus with other respiratory manifestations: Secondary | ICD-10-CM

## 2024-06-19 DIAGNOSIS — R52 Pain, unspecified: Secondary | ICD-10-CM | POA: Diagnosis not present

## 2024-06-19 LAB — POC COVID19 BINAXNOW: SARS Coronavirus 2 Ag: NEGATIVE

## 2024-06-19 LAB — POCT INFLUENZA A/B
Influenza A, POC: POSITIVE — AB
Influenza B, POC: NEGATIVE

## 2024-06-19 MED ORDER — METHYLPREDNISOLONE 4 MG PO TBPK: ORAL_TABLET | ORAL | 0 refills | Status: AC

## 2024-06-19 NOTE — Telephone Encounter (Signed)
 FYI Only or Action Required?: FYI only for provider: appointment scheduled on 06/19/24.  Patient was last seen in primary care on 10/12/2023 by Jarold Medici, MD.  Called Nurse Triage reporting Sore Throat.  Symptoms began several days ago.  Interventions attempted: OTC medications: Theraflu, ibuprofen .  Symptoms are: unchanged.  Triage Disposition: See Physician Within 24 Hours  Patient/caregiver understands and will follow disposition?: Yes   Copied from CRM #[id number]. Topic: Clinical - Red Word Triage >> Jun 19, 2024 12:32 PM Fonda T wrote: [facility] that prompted transfer to Nurse Triage: Pt calling reports  she has worsening chest congestion, fever, sore throat/pain, muscle weakness, and loss of appetite.   Has been ongoing and worsening for about 3 days.   Pt would like an appt for evaluation. Reason for Disposition  SEVERE throat pain (e.g., excruciating)  Answer Assessment - Initial Assessment Questions No available appts with pcp, scheduled 06/19/24.  Advised call back or ED/911 if symptoms worsen.  Encouraged mask, hand hygiene, increase fluids and medicate fever. 1. ONSET: When did the throat start hurting? (Hours or days ago)      Fever 102.4(took theraflu), sore throat, chest congestion, bodyaches, loss of appeitite, fatigue 2. SEVERITY: How bad is the sore throat? (Scale 1-10; mild, moderate or severe)     5/10; able to swallow saliva/ fluids, denies drooling 3. STREP EXPOSURE: Has there been any exposure to strep within the past week? If Yes, ask: What type of contact occurred?      unsure 4.  VIRAL SYMPTOMS: Are there any symptoms of a cold, such as a runny nose, cough, hoarse voice or red eyes?      Cough, chest congestion; yellow, runny nose,sob with exertion 5. FEVER: Do you have a fever? If Yes, ask: What is your temperature, how was it measured, and when did it start?     102.4 6. PUS ON THE TONSILS: Is there pus on the tonsils in the back  of your throat?     Unable to look 7. OTHER SYMPTOMS: Do you have any other symptoms? (e.g., difficulty breathing, headache, rash)     Denies dizziness, HA, rash, diff breathing, chest pain  Protocols used: Sore Throat-A-AH

## 2024-06-19 NOTE — Progress Notes (Signed)
 Name: Rachel Zamora   Date of Visit: 06/19/24   Date of last visit with me: Visit date not found   CHIEF COMPLAINT:  Chief Complaint  Patient presents with   Cough    Cough, ST, runny nose, body aches, chills, 102.4 temp and fatigue x 3 days.        HPI:  Discussed the use of AI scribe software for clinical note transcription with the patient, who gave verbal consent to proceed.  History of Present Illness   Rachel Zamora is a 53 year old female who presents with flu symptoms for three days.  She has been experiencing symptoms consistent with the flu, including a cough, difficulty breathing, and a sore throat, which began on Saturday night, approximately three days ago.  She has not received a flu shot this season. She is currently not taking any regular medications. No history of asthma or similar respiratory conditions is reported.         OBJECTIVE:       10/12/2023    2:50 PM  Depression screen PHQ 2/9  Decreased Interest 0  Down, Depressed, Hopeless 0  PHQ - 2 Score 0  Altered sleeping 0  Tired, decreased energy 0  Change in appetite 0  Feeling bad or failure about yourself  0  Trouble concentrating 0  Moving slowly or fidgety/restless 0  Suicidal thoughts 0  PHQ-9 Score 0   Difficult doing work/chores Not difficult at all     Data saved with a previous flowsheet row definition     BP Readings from Last 3 Encounters:  06/19/24 120/70  11/08/23 120/70  10/12/23 124/70    BP 120/70   Pulse 80   Temp (!) 101 F (38.3 C) (Tympanic)   Ht 5' 6 (1.676 m)   Wt 201 lb 6.4 oz (91.4 kg)   LMP 10/25/2023 (Approximate)   Breastfeeding No   BMI 32.51 kg/m    Physical Exam   HEENT: Conjunctival injection.      Physical Exam Constitutional:      Appearance: Normal appearance. She is normal weight.  HENT:     Nose: Congestion and rhinorrhea present.     Mouth/Throat:     Mouth: Mucous membranes are moist.     Pharynx: Oropharynx is clear.  Posterior oropharyngeal erythema present. No oropharyngeal exudate.  Cardiovascular:     Rate and Rhythm: Normal rate and regular rhythm.     Pulses: Normal pulses.     Heart sounds: Normal heart sounds.  Neurological:     General: No focal deficit present.     Mental Status: She is alert and oriented to person, place, and time. Mental status is at baseline.     ASSESSMENT/PLAN:   Assessment & Plan Fever, unspecified fever cause  Acute cough  Sore throat  Body aches  Influenza A    Assessment and Plan    Influenza A with acute respiratory symptoms (cough, pharyngitis, fever, pain) Symptoms severe, outside Tamiflu window. No asthma. No flu shot, increasing symptom severity and duration. Expected recovery in five days, possible residual weakness by day seven. - Prescribed short course of steroids for pain and function. - Advised alternating acetaminophen  and ibuprofen : 1000 mg acetaminophen  upon waking, ibuprofen  four hours later, then acetaminophen  four hours after ibuprofen . - Instructed to follow up with Doctor Jarold if symptoms worsen. - Advised infectious for two more days.       Total time spent on the date of the  encounter was 34 minutes, which included reviewing the patient's chart, performing a history and physical exam, ordering and reviewing studies, coordinating care, and counseling the patient regarding diagnosis and treatment options. The time spent was medically necessary and supports billing based on total time.   Lin Glazier A. Vita MD [facility]

## 2024-10-16 ENCOUNTER — Encounter: Admitting: Internal Medicine

## 2024-10-18 ENCOUNTER — Encounter: Payer: Self-pay | Admitting: Internal Medicine
